# Patient Record
Sex: Female | Born: 1950 | Race: Black or African American | Hispanic: No | Marital: Single | Smoking: Former smoker
Health system: Southern US, Community
[De-identification: ages and names within clinical notes are randomized; demographics above are authoritative.]

## PROBLEM LIST (undated history)

## (undated) DIAGNOSIS — C801 Malignant (primary) neoplasm, unspecified: Secondary | ICD-10-CM

## (undated) DIAGNOSIS — I10 Essential (primary) hypertension: Secondary | ICD-10-CM

## (undated) HISTORY — PX: TUBAL LIGATION: SHX77

## (undated) HISTORY — PX: COLECTOMY: SHX59

---

## 1997-08-13 ENCOUNTER — Emergency Department (HOSPITAL_COMMUNITY): Admission: EM | Admit: 1997-08-13 | Discharge: 1997-08-13 | Payer: Self-pay | Admitting: Emergency Medicine

## 1997-09-26 ENCOUNTER — Encounter: Admission: RE | Admit: 1997-09-26 | Discharge: 1997-12-25 | Payer: Self-pay | Admitting: *Deleted

## 1998-05-19 ENCOUNTER — Other Ambulatory Visit: Admission: RE | Admit: 1998-05-19 | Discharge: 1998-05-19 | Payer: Self-pay

## 2000-04-30 ENCOUNTER — Emergency Department (HOSPITAL_COMMUNITY): Admission: EM | Admit: 2000-04-30 | Discharge: 2000-04-30 | Payer: Self-pay | Admitting: Emergency Medicine

## 2001-11-14 ENCOUNTER — Emergency Department (HOSPITAL_COMMUNITY): Admission: EM | Admit: 2001-11-14 | Discharge: 2001-11-15 | Payer: Self-pay | Admitting: *Deleted

## 2001-11-14 ENCOUNTER — Encounter: Payer: Self-pay | Admitting: *Deleted

## 2007-03-22 ENCOUNTER — Encounter: Admission: RE | Admit: 2007-03-22 | Discharge: 2007-03-22 | Payer: Self-pay | Admitting: Gastroenterology

## 2007-04-20 ENCOUNTER — Inpatient Hospital Stay (HOSPITAL_COMMUNITY): Admission: RE | Admit: 2007-04-20 | Discharge: 2007-04-24 | Payer: Self-pay | Admitting: General Surgery

## 2007-04-20 ENCOUNTER — Encounter (INDEPENDENT_AMBULATORY_CARE_PROVIDER_SITE_OTHER): Payer: Self-pay | Admitting: General Surgery

## 2007-04-28 ENCOUNTER — Ambulatory Visit: Payer: Self-pay | Admitting: Hematology and Oncology

## 2007-05-05 LAB — COMPREHENSIVE METABOLIC PANEL
AST: 18 U/L (ref 0–37)
Alkaline Phosphatase: 105 U/L (ref 39–117)
BUN: 18 mg/dL (ref 6–23)
Calcium: 9.1 mg/dL (ref 8.4–10.5)
Chloride: 100 mEq/L (ref 96–112)
Creatinine, Ser: 1.15 mg/dL (ref 0.40–1.20)

## 2007-05-05 LAB — IRON AND TIBC
TIBC: 348 ug/dL (ref 250–470)
UIBC: 314 ug/dL

## 2007-05-05 LAB — CBC WITH DIFFERENTIAL/PLATELET
Basophils Absolute: 0 10*3/uL (ref 0.0–0.1)
EOS%: 4.1 % (ref 0.0–7.0)
Eosinophils Absolute: 0.3 10*3/uL (ref 0.0–0.5)
LYMPH%: 23.9 % (ref 14.0–48.0)
MCH: 27.5 pg (ref 26.0–34.0)
MCV: 82.4 fL (ref 81.0–101.0)
MONO%: 7.4 % (ref 0.0–13.0)
NEUT#: 5.3 10*3/uL (ref 1.5–6.5)
Platelets: 577 10*3/uL — ABNORMAL HIGH (ref 145–400)
RBC: 3.52 10*6/uL — ABNORMAL LOW (ref 3.70–5.32)
RDW: 16 % — ABNORMAL HIGH (ref 11.3–14.5)

## 2007-05-10 ENCOUNTER — Ambulatory Visit (HOSPITAL_COMMUNITY): Admission: RE | Admit: 2007-05-10 | Discharge: 2007-05-10 | Payer: Self-pay | Admitting: Hematology and Oncology

## 2007-07-06 ENCOUNTER — Encounter: Admission: RE | Admit: 2007-07-06 | Discharge: 2007-07-06 | Payer: Self-pay | Admitting: General Surgery

## 2007-07-31 ENCOUNTER — Ambulatory Visit: Payer: Self-pay | Admitting: Hematology and Oncology

## 2007-08-21 LAB — CBC WITH DIFFERENTIAL/PLATELET
BASO%: 0.9 % (ref 0.0–2.0)
Basophils Absolute: 0.1 10*3/uL (ref 0.0–0.1)
EOS%: 4.7 % (ref 0.0–7.0)
HGB: 11.5 g/dL — ABNORMAL LOW (ref 11.6–15.9)
MCH: 29.2 pg (ref 26.0–34.0)
MCHC: 33.9 g/dL (ref 32.0–36.0)
MCV: 86.2 fL (ref 81.0–101.0)
MONO%: 5.9 % (ref 0.0–13.0)
RBC: 3.93 10*6/uL (ref 3.70–5.32)
RDW: 15.9 % — ABNORMAL HIGH (ref 11.3–14.5)
lymph#: 3 10*3/uL (ref 0.9–3.3)

## 2007-08-21 LAB — COMPREHENSIVE METABOLIC PANEL
ALT: 12 U/L (ref 0–35)
AST: 12 U/L (ref 0–37)
Albumin: 4 g/dL (ref 3.5–5.2)
Alkaline Phosphatase: 97 U/L (ref 39–117)
BUN: 14 mg/dL (ref 6–23)
Calcium: 8.8 mg/dL (ref 8.4–10.5)
Chloride: 104 mEq/L (ref 96–112)
Potassium: 3.8 mEq/L (ref 3.5–5.3)
Sodium: 140 mEq/L (ref 135–145)

## 2007-11-17 ENCOUNTER — Ambulatory Visit: Payer: Self-pay | Admitting: Hematology and Oncology

## 2007-11-21 ENCOUNTER — Ambulatory Visit (HOSPITAL_COMMUNITY): Admission: RE | Admit: 2007-11-21 | Discharge: 2007-11-21 | Payer: Self-pay | Admitting: Hematology and Oncology

## 2007-11-21 LAB — COMPREHENSIVE METABOLIC PANEL
ALT: 16 U/L (ref 0–35)
AST: 21 U/L (ref 0–37)
Albumin: 3.6 g/dL (ref 3.5–5.2)
CO2: 27 mEq/L (ref 19–32)
Calcium: 8.8 mg/dL (ref 8.4–10.5)
Chloride: 104 mEq/L (ref 96–112)
Creatinine, Ser: 1.03 mg/dL (ref 0.40–1.20)
Potassium: 3.8 mEq/L (ref 3.5–5.3)

## 2007-11-21 LAB — CBC WITH DIFFERENTIAL/PLATELET
EOS%: 2.4 % (ref 0.0–7.0)
Eosinophils Absolute: 0.2 10*3/uL (ref 0.0–0.5)
LYMPH%: 41.8 % (ref 14.0–48.0)
MCH: 29.9 pg (ref 26.0–34.0)
MCV: 89.6 fL (ref 81.0–101.0)
MONO%: 8 % (ref 0.0–13.0)
NEUT#: 3.7 10*3/uL (ref 1.5–6.5)
Platelets: 333 10*3/uL (ref 145–400)
RBC: 4.08 10*6/uL (ref 3.70–5.32)
RDW: 15.7 % — ABNORMAL HIGH (ref 11.3–14.5)

## 2007-11-21 LAB — CEA: CEA: 1.6 ng/mL (ref 0.0–5.0)

## 2008-05-17 ENCOUNTER — Ambulatory Visit: Payer: Self-pay | Admitting: Hematology and Oncology

## 2008-05-22 ENCOUNTER — Ambulatory Visit (HOSPITAL_COMMUNITY): Admission: RE | Admit: 2008-05-22 | Discharge: 2008-05-22 | Payer: Self-pay | Admitting: Hematology and Oncology

## 2008-05-22 LAB — CBC WITH DIFFERENTIAL/PLATELET
BASO%: 0.4 % (ref 0.0–2.0)
Basophils Absolute: 0 10*3/uL (ref 0.0–0.1)
EOS%: 2.7 % (ref 0.0–7.0)
HGB: 13.1 g/dL (ref 11.6–15.9)
MCH: 30.3 pg (ref 25.1–34.0)
MCV: 89.6 fL (ref 79.5–101.0)
MONO%: 7.7 % (ref 0.0–14.0)
RBC: 4.33 10*6/uL (ref 3.70–5.45)
RDW: 13.9 % (ref 11.2–14.5)
lymph#: 2.1 10*3/uL (ref 0.9–3.3)

## 2008-05-22 LAB — COMPREHENSIVE METABOLIC PANEL
ALT: 18 U/L (ref 0–35)
AST: 22 U/L (ref 0–37)
Albumin: 3.7 g/dL (ref 3.5–5.2)
Alkaline Phosphatase: 114 U/L (ref 39–117)
Calcium: 9 mg/dL (ref 8.4–10.5)
Chloride: 103 mEq/L (ref 96–112)
Potassium: 3.6 mEq/L (ref 3.5–5.3)
Sodium: 141 mEq/L (ref 135–145)
Total Protein: 7.8 g/dL (ref 6.0–8.3)

## 2009-01-09 ENCOUNTER — Ambulatory Visit: Payer: Self-pay | Admitting: Hematology and Oncology

## 2009-02-20 ENCOUNTER — Ambulatory Visit: Payer: Self-pay | Admitting: Hematology and Oncology

## 2009-02-25 LAB — CBC WITH DIFFERENTIAL/PLATELET
Basophils Absolute: 0.1 10*3/uL (ref 0.0–0.1)
Eosinophils Absolute: 0.3 10*3/uL (ref 0.0–0.5)
HCT: 37.7 % (ref 34.8–46.6)
HGB: 12.5 g/dL (ref 11.6–15.9)
LYMPH%: 35.4 % (ref 14.0–49.7)
MCHC: 33.3 g/dL (ref 31.5–36.0)
MONO#: 0.6 10*3/uL (ref 0.1–0.9)
NEUT%: 55.2 % (ref 38.4–76.8)
Platelets: 345 10*3/uL (ref 145–400)
WBC: 10.2 10*3/uL (ref 3.9–10.3)

## 2009-02-25 LAB — CEA: CEA: 1.7 ng/mL (ref 0.0–5.0)

## 2009-02-25 LAB — COMPREHENSIVE METABOLIC PANEL
BUN: 15 mg/dL (ref 6–23)
CO2: 22 mEq/L (ref 19–32)
Creatinine, Ser: 0.9 mg/dL (ref 0.40–1.20)
Glucose, Bld: 89 mg/dL (ref 70–99)
Total Bilirubin: 0.5 mg/dL (ref 0.3–1.2)

## 2009-07-19 IMAGING — CR DG CHEST 2V
2 series · 2 of 2 positions shown · non-contrast
Comparison: None.

CLINICAL DATA: Preop evaluation for right colon mass

[view not recorded (1 of 2)]
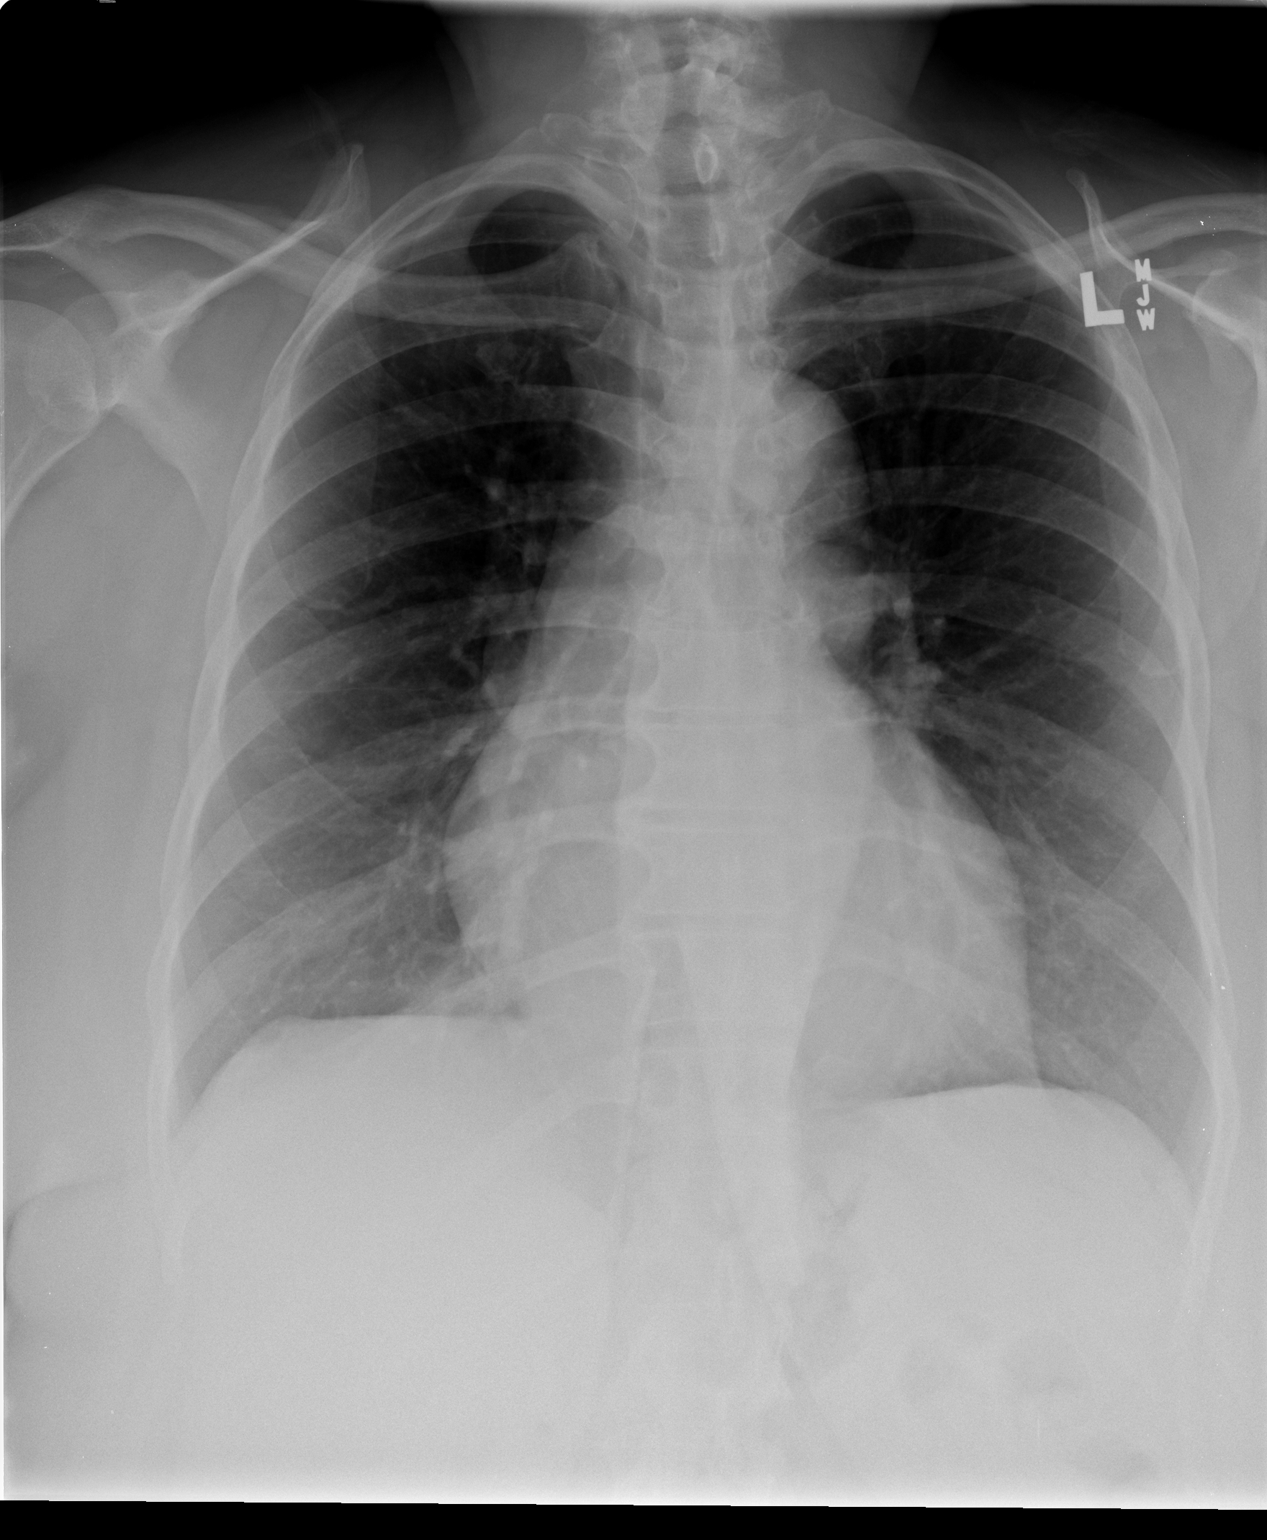

[view not recorded (2 of 2)]
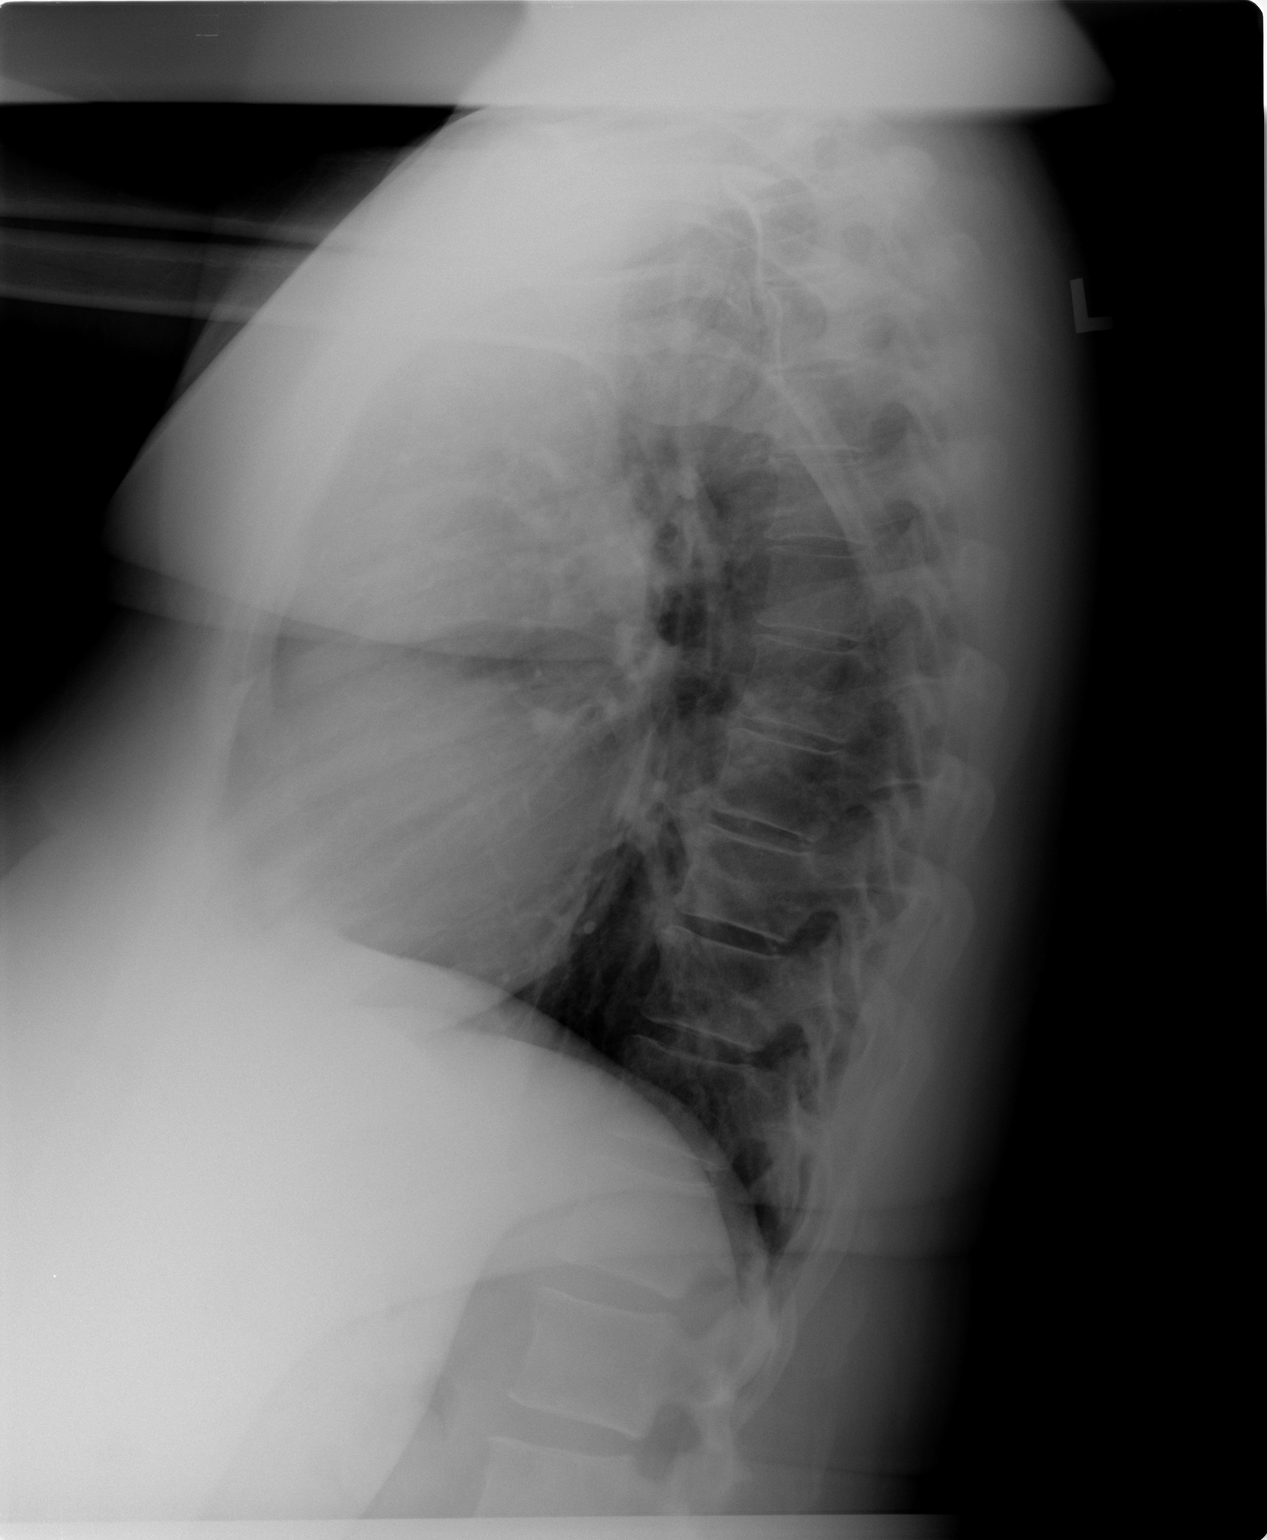

[2 of 2 positions shown; findings below may reference images not displayed]

CHEST - 2 VIEW:

Lungs are clear. Cardiopericardial silhouette is at upper limits of normal to
borderline enlarged for size. No parenchymal nodule or mass is evident. Imaged
bony structures of the thorax are intact.
IMPRESSION: No acute cardiopulmonary process

## 2009-11-21 ENCOUNTER — Ambulatory Visit: Payer: Self-pay | Admitting: Hematology and Oncology

## 2010-03-14 ENCOUNTER — Encounter: Payer: Self-pay | Admitting: Hematology and Oncology

## 2010-07-07 NOTE — Discharge Summary (Signed)
NAMEBARBRA, MINER            ACCOUNT NO.:  [id number]   MEDICAL RECORD NO.:  [id number]          PATIENT TYPE:  INP   LOCATION:  5151                         FACILITY:  [facility]   PHYSICIAN:  Cherylynn Ridges, M.D.    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   PRINCIPAL PROCEDURE:  Right colectomy by Dr. Lindie Spruce, done on day of  admission, April 20, 2007.   She is being discharged home in care of a friend and family.   COMPLICATIONS:  None.   CONDITION:  Good.   DISCHARGE MEDICATIONS:  Vicodin 1-2 tablets every 4 hours as needed for  pain.   FOLLOWUP:  She is to follow up to see me in approximately 1 week.   WOUND CARE:  Shower and pat her wound dry and cover it to keep it from  clothes.  She is to return to see me again in 1 week.   HOSPITAL COURSE:  The patient is a 60 year old female who is found to  have a large, dysplastic polyp of the right colon.  Biopsies  demonstrated to be tubular villous adenoma with dysplasia.  She came in  for a right colectomy.  This was performed with good margins grossly.  However, pathology is still pending.   Postoperatively with the assistance of Inter EDbeing give orally, the  patient went on to start having bowel activity on postop day #3, was  advanced to a soft diet and had several bowel movements prior to  discharge.  She was discharged on postop day #4.  Her wound looked clean  and dry with no evidence of infection.  No drainage.  She had been  afebrile for 24 hours prior to discharge.  Her blood pressure and pulse  were normal.  She is to follow up see me in approximately 1 week.      Cherylynn Ridges, M.D.  Electronically Signed     JOW/MEDQ  D:  04/24/2007  T:   04/24/2007  Job:  [id number]   cc:   Cherylynn Ridges, M.D.

## 2010-07-07 NOTE — Op Note (Signed)
[name], [name]            ACCOUNT NO.:  [id number]   MEDICAL RECORD NO.:  [id number]          PATIENT TYPE:  INP   LOCATION:  2899                         FACILITY:  [facility]   PHYSICIAN:  Cherylynn Ridges, M.D.    DATE OF BIRTH:  [birth year]   DATE OF PROCEDURE:  04/20/2007  DATE OF DISCHARGE:                               OPERATIVE REPORT   PREOPERATIVE DIAGNOSIS:  Dysplastic right colon polyp.   POSTOPERATIVE DIAGNOSIS:  Dysplastic right colon polyp.   PROCEDURE:  Partial right colectomy.   SURGEON:  Cherylynn Ridges, M.D.   ASSISTANT:  Leonie Man, M.D.   ANESTHESIA:  General endotracheal.   ESTIMATED BLOOD LOSS:  Less than 100 mL.   COMPLICATIONS:  None.   CONDITION:  Stable.   FINDINGS:  A sessile polyp about 3-4 cm in size of the mid right  descending colon.  Other findings of normal liver, spleen, stomach and  duodenum, no evidence of metastatic disease.   INDICATIONS FOR OPERATION:  The patient is a 60 year old female, on a  routine colonoscopy was found to have a polyp of the right colon that  was too broad-based and large for removal colonoscopically, who now  comes in for colectomy.   OPERATION:  The patient was taken to the operating room and placed on  the table in a supine position.  After an adequate general endotracheal  anesthetic was administered, she was prepped and draped in the usual  sterile manner exposing the midline.   A midline incision was made, which ended up being approximately 15 cm  long, above and to the left of the umbilicus down to below the  umbilicus.  This was taken down to and through the midline fascia.  The  patient had at least 2-1/2 to 3 inches of pannus.   Once we got down to the midline fascia, we entered the peritoneal cavity  using electrocautery, taking care not to injure the underlying bowel.  There were omental adhesions to the anterior abdominal wall in the  umbilical area, which were taken down using  electrocautery with a 2-0  silk tie being placed on the remaining omentum.   We placed the patient in Trendelenburg and the left side was tilted  down, then the surgeon went to the patient's left.  With retractors in  place we dissected out the right colon at the line of Toldt up to and  including the proximal portion of the right transverse colon and the  hepatic flexure.  The polyp that had been noted colonoscopically was  palpated transmurally in the operating room.  We mobilized the entire  right colon including down beyond the terminal ileum including the  appendix, which was removed with the specimen.  I GIA-75 was brought  across the distal ascending colon and the terminal ileum.  The  subsequent ends of the terminal ileum and the proximal transverse colon  were brought together using a GIA-75 stapler with the resulting  enterotomy being closed with a TX-60 blue 3.5-mm closure stapler.  The  intervening mesentery was closed using interrupted figure-of-eight  stitches of 2-0 silk.  We inspected the right colon and paracolic area  for bleeding.  There was minimal bleeding and it was irrigated with  saline.  Prior to closure and doing the anasetomosis, the specimen was  taken off the field and opened showing the sessile polyp.   Once this was completed, the anastomosis was completed and the mesentery  was closed and irrigation was done, adequate hemostasis was obtained.  We closed the midline fascia using a running looped #1 PDS suture.  We  irrigated the subcu with saline and then closed the skin using stainless  steel staples.  All needle counts, sponge counts and instrument counts  were correct.      Cherylynn Ridges, M.D.  Electronically Signed     JOW/MEDQ  D:  04/20/2007  T:  04/21/2007  Job:  [id number]   cc:   Merlene Laughter. Renae Gloss, M.D.  Anselmo Rod, M.D.

## 2010-07-30 ENCOUNTER — Other Ambulatory Visit: Payer: Self-pay | Admitting: Hematology and Oncology

## 2010-07-30 DIAGNOSIS — C189 Malignant neoplasm of colon, unspecified: Secondary | ICD-10-CM

## 2010-08-03 ENCOUNTER — Encounter (HOSPITAL_COMMUNITY)
Admission: RE | Admit: 2010-08-03 | Discharge: 2010-08-03 | Disposition: A | Discharge status: Home / Self Care | Payer: BC Managed Care – PPO | Source: Ambulatory Visit | Attending: Hematology and Oncology | Admitting: Hematology and Oncology

## 2010-08-03 ENCOUNTER — Encounter (HOSPITAL_BASED_OUTPATIENT_CLINIC_OR_DEPARTMENT_OTHER): Payer: BC Managed Care – PPO | Admitting: Hematology and Oncology

## 2010-08-03 ENCOUNTER — Other Ambulatory Visit: Payer: Self-pay | Admitting: Hematology and Oncology

## 2010-08-03 ENCOUNTER — Encounter (HOSPITAL_COMMUNITY): Payer: Self-pay

## 2010-08-03 DIAGNOSIS — C189 Malignant neoplasm of colon, unspecified: Secondary | ICD-10-CM

## 2010-08-03 DIAGNOSIS — C182 Malignant neoplasm of ascending colon: Secondary | ICD-10-CM

## 2010-08-03 DIAGNOSIS — N859 Noninflammatory disorder of uterus, unspecified: Secondary | ICD-10-CM | POA: Insufficient documentation

## 2010-08-03 DIAGNOSIS — K7689 Other specified diseases of liver: Secondary | ICD-10-CM | POA: Insufficient documentation

## 2010-08-03 HISTORY — DX: Malignant (primary) neoplasm, unspecified: C80.1

## 2010-08-03 HISTORY — DX: Essential (primary) hypertension: I10

## 2010-08-03 LAB — CBC WITH DIFFERENTIAL/PLATELET
BASO%: 0.3 % (ref 0.0–2.0)
Basophils Absolute: 0 10*3/uL (ref 0.0–0.1)
HCT: 38.2 % (ref 34.8–46.6)
HGB: 12.8 g/dL (ref 11.6–15.9)
MONO#: 0.5 10*3/uL (ref 0.1–0.9)
NEUT%: 47.7 % (ref 38.4–76.8)
RDW: 14.2 % (ref 11.2–14.5)
WBC: 8.1 10*3/uL (ref 3.9–10.3)
lymph#: 3.5 10*3/uL — ABNORMAL HIGH (ref 0.9–3.3)

## 2010-08-03 LAB — CMP (CANCER CENTER ONLY)
ALT(SGPT): 17 U/L (ref 10–47)
Albumin: 3.5 g/dL (ref 3.3–5.5)
BUN, Bld: 10 mg/dL (ref 7–22)
CO2: 29 mEq/L (ref 18–33)
Calcium: 9.2 mg/dL (ref 8.0–10.3)
Chloride: 96 mEq/L — ABNORMAL LOW (ref 98–108)
Creat: 0.9 mg/dl (ref 0.6–1.2)
Potassium: 4.1 mEq/L (ref 3.3–4.7)

## 2010-08-03 LAB — CEA: CEA: 1.2 ng/mL (ref 0.0–5.0)

## 2010-08-03 MED ORDER — IOHEXOL 300 MG/ML  SOLN
125.0000 mL | Freq: Once | INTRAMUSCULAR | Status: AC | PRN
  Administered 2010-08-03: 125 mL via INTRAVENOUS

## 2010-08-07 ENCOUNTER — Encounter (HOSPITAL_BASED_OUTPATIENT_CLINIC_OR_DEPARTMENT_OTHER): Payer: BC Managed Care – PPO | Admitting: Hematology and Oncology

## 2010-08-07 DIAGNOSIS — C182 Malignant neoplasm of ascending colon: Secondary | ICD-10-CM

## 2010-08-27 ENCOUNTER — Other Ambulatory Visit: Payer: Self-pay | Admitting: Internal Medicine

## 2010-08-27 DIAGNOSIS — R1900 Intra-abdominal and pelvic swelling, mass and lump, unspecified site: Secondary | ICD-10-CM

## 2010-08-28 ENCOUNTER — Other Ambulatory Visit: Payer: Self-pay | Admitting: Internal Medicine

## 2010-08-28 DIAGNOSIS — R1900 Intra-abdominal and pelvic swelling, mass and lump, unspecified site: Secondary | ICD-10-CM

## 2010-09-02 ENCOUNTER — Ambulatory Visit
Admission: RE | Admit: 2010-09-02 | Discharge: 2010-09-02 | Disposition: A | Discharge status: Home / Self Care | Payer: BC Managed Care – PPO | Source: Ambulatory Visit | Attending: Internal Medicine | Admitting: Internal Medicine

## 2010-09-02 DIAGNOSIS — R1900 Intra-abdominal and pelvic swelling, mass and lump, unspecified site: Secondary | ICD-10-CM

## 2010-11-13 LAB — COMPREHENSIVE METABOLIC PANEL
AST: 19
Albumin: 3.5
Chloride: 104
Creatinine, Ser: 0.89
GFR calc Af Amer: >60
Potassium: 3.3 — ABNORMAL LOW
Total Bilirubin: 0.4

## 2010-11-13 LAB — CBC
MCV: 83.3
Platelets: 386
WBC: 8.4

## 2010-11-13 LAB — DIFFERENTIAL
Basophils Absolute: 0.1
Eosinophils Relative: 2
Lymphocytes Relative: 26
Monocytes Absolute: 0.4

## 2010-11-13 LAB — ABO/RH: ABO/RH(D): B POS

## 2010-11-13 LAB — TYPE AND SCREEN: ABO/RH(D): B POS

## 2011-07-14 ENCOUNTER — Other Ambulatory Visit: Payer: Self-pay | Admitting: Hematology and Oncology

## 2011-07-14 ENCOUNTER — Other Ambulatory Visit (HOSPITAL_COMMUNITY): Payer: BC Managed Care – PPO

## 2011-07-14 ENCOUNTER — Telehealth: Payer: Self-pay | Admitting: Hematology and Oncology

## 2011-07-14 DIAGNOSIS — C189 Malignant neoplasm of colon, unspecified: Secondary | ICD-10-CM

## 2011-07-14 NOTE — Telephone Encounter (Signed)
lmonvm for pt re appt for 6/19 lb/ct and 6/26 LO. Schedule referral mailed today.

## 2011-07-28 ENCOUNTER — Other Ambulatory Visit: Payer: Self-pay | Admitting: *Deleted

## 2011-07-28 ENCOUNTER — Telehealth: Payer: Self-pay | Admitting: *Deleted

## 2011-07-28 NOTE — Telephone Encounter (Signed)
per orders from 07-28-2011 moved patient to NP schedule same time at 2:30pm

## 2011-08-11 ENCOUNTER — Other Ambulatory Visit: Payer: BC Managed Care – PPO | Admitting: Lab

## 2011-08-11 ENCOUNTER — Ambulatory Visit (HOSPITAL_COMMUNITY): Payer: BC Managed Care – PPO

## 2011-08-18 ENCOUNTER — Encounter: Payer: BC Managed Care – PPO | Admitting: Nurse Practitioner

## 2011-08-23 ENCOUNTER — Other Ambulatory Visit: Payer: Self-pay | Admitting: *Deleted

## 2011-08-24 ENCOUNTER — Telehealth: Payer: Self-pay | Admitting: *Deleted

## 2011-08-24 NOTE — Telephone Encounter (Signed)
Pt FTKA for f/u with Ramond Craver, NP on 08/18/11.    Spoke with pt today and was informed that pt had called office to cancel appt on 08/18/11.   Pt stated she wished to reschedule appt in August.   Pt stated she was doing fine , no problems.   Informed pt that a scheduler will contact pt with appt schedule as instructed by md.   Pt voiced understanding.

## 2011-09-17 ENCOUNTER — Other Ambulatory Visit: Payer: Self-pay | Admitting: *Deleted

## 2011-09-17 DIAGNOSIS — C189 Malignant neoplasm of colon, unspecified: Secondary | ICD-10-CM

## 2011-09-22 ENCOUNTER — Telehealth: Payer: Self-pay | Admitting: Hematology and Oncology

## 2011-09-22 NOTE — Telephone Encounter (Signed)
l/m with appt info and to p./u contrast    aom

## 2011-10-04 ENCOUNTER — Telehealth: Payer: Self-pay | Admitting: Hematology and Oncology

## 2011-10-04 ENCOUNTER — Other Ambulatory Visit: Payer: BC Managed Care – PPO | Admitting: Lab

## 2011-10-04 ENCOUNTER — Other Ambulatory Visit: Payer: Self-pay | Admitting: *Deleted

## 2011-10-04 ENCOUNTER — Telehealth: Payer: Self-pay | Admitting: *Deleted

## 2011-10-04 ENCOUNTER — Other Ambulatory Visit (HOSPITAL_COMMUNITY): Payer: BC Managed Care – PPO

## 2011-10-04 DIAGNOSIS — C189 Malignant neoplasm of colon, unspecified: Secondary | ICD-10-CM

## 2011-10-04 NOTE — Telephone Encounter (Signed)
Pt called to inform nurse re:  Pt was not aware of appts for August.  Pt stated August is not good for pt to have any appts.   Pt had rescheduled CT scans for  11/02/11.    Informed pt that a scheduler will contact pt for labs prior to CT scans, and a f/u appt with NP will be rescheduled also.   Pt voiced understanding. Pt's  Phone    [phone].

## 2011-10-04 NOTE — Telephone Encounter (Signed)
Pt called today to r/s appts for lb/ct/fu. Pt given number to r/s ct and will call back to r/s lb/fu.

## 2011-10-04 NOTE — Telephone Encounter (Signed)
S/w pt re d/t's for lb 9/10 and f/u 9/13. Pt has already changed scan to 9/10. Pt will get prep prior to 9/10 ct appt.

## 2011-10-07 ENCOUNTER — Ambulatory Visit: Payer: BC Managed Care – PPO | Admitting: Family

## 2011-11-02 ENCOUNTER — Other Ambulatory Visit (HOSPITAL_COMMUNITY): Payer: BC Managed Care – PPO

## 2011-11-02 ENCOUNTER — Other Ambulatory Visit: Payer: BC Managed Care – PPO | Admitting: Lab

## 2011-11-03 ENCOUNTER — Telehealth: Payer: Self-pay | Admitting: Nurse Practitioner

## 2011-11-03 NOTE — Telephone Encounter (Signed)
Pt FTKA lab and CT scan appt.   Called pt to inquire if/when she was rescheduling CT/ lab.  Need to inform her that MD appt will need to be moved to after CT scan.  MD visit currently scheduled for 9/13.    This is patient's fourth no show to CT scan appointment.

## 2011-11-05 ENCOUNTER — Telehealth: Payer: Self-pay | Admitting: Nurse Practitioner

## 2011-11-05 ENCOUNTER — Ambulatory Visit: Payer: BC Managed Care – PPO | Admitting: Family

## 2011-11-05 NOTE — Telephone Encounter (Signed)
Left second message for patient re: missed appointments.  Req return call to this office re: would she like to be rescheduled or is her preference to discontinue visits here?

## 2012-05-02 NOTE — Progress Notes (Signed)
Patient was no-show, encounter closed

## 2019-04-28 ENCOUNTER — Ambulatory Visit: Payer: BC Managed Care – PPO

## 2019-05-19 ENCOUNTER — Ambulatory Visit: Payer: BC Managed Care – PPO

## 2019-06-02 ENCOUNTER — Ambulatory Visit: Payer: BC Managed Care – PPO | Attending: Internal Medicine

## 2019-06-02 DIAGNOSIS — Z23 Encounter for immunization: Secondary | ICD-10-CM

## 2019-06-02 NOTE — Progress Notes (Signed)
   Covid-19 Vaccination Clinic  Name:  Lejla Taflinger    MRN: [id number] DOB: 1950-08-10  06/02/2019  Ms. Vittone was observed post Covid-19 immunization for 15 minutes without incident. She was provided with Vaccine Information Sheet and instruction to access the V-Safe system.   Ms. Piotrowicz was instructed to call 911 with any severe reactions post vaccine: Marland Kitchen Difficulty breathing  . Swelling of face and throat  . A fast heartbeat  . A bad rash all over body  . Dizziness and weakness   Immunizations Administered    Name Date Dose VIS Date Route   Moderna COVID-19 Vaccine 06/02/2019 10:19 AM 0.5 mL 01/23/2019 Intramuscular   Manufacturer: Levan Hurst   Lot: [id number]   [id number]

## 2019-06-30 ENCOUNTER — Ambulatory Visit: Payer: BC Managed Care – PPO | Attending: Internal Medicine

## 2019-06-30 DIAGNOSIS — Z23 Encounter for immunization: Secondary | ICD-10-CM

## 2019-06-30 NOTE — Progress Notes (Signed)
   Covid-19 Vaccination Clinic  Name:  [name]    MRN: [id number] DOB: 1950/12/26  06/30/2019  [name] was observed post Covid-19 immunization for 15 minutes without incident. She was provided with Vaccine Information Sheet and instruction to access the V-Safe system.   [name] was instructed to call 911 with any severe reactions post vaccine: Marland Kitchen Difficulty breathing  . Swelling of face and throat  . A fast heartbeat  . A bad rash all over body  . Dizziness and weakness   Immunizations Administered    Name Date Dose VIS Date Route   Moderna COVID-19 Vaccine 06/30/2019  9:55 AM 0.5 mL 01/2019 Intramuscular   Manufacturer: Moderna   Lot: [id number]   [id number]

## 2021-04-23 ENCOUNTER — Ambulatory Visit
Admission: RE | Admit: 2021-04-23 | Discharge: 2021-04-23 | Disposition: A | Discharge status: Home / Self Care | Payer: Medicare PPO | Source: Ambulatory Visit | Attending: Family Medicine | Admitting: Family Medicine

## 2021-04-23 ENCOUNTER — Other Ambulatory Visit: Payer: Self-pay | Admitting: Family Medicine

## 2021-04-23 DIAGNOSIS — M549 Dorsalgia, unspecified: Secondary | ICD-10-CM

## 2021-04-23 DIAGNOSIS — M25559 Pain in unspecified hip: Secondary | ICD-10-CM

## 2021-04-24 ENCOUNTER — Other Ambulatory Visit: Payer: Self-pay | Admitting: Family Medicine

## 2021-04-24 DIAGNOSIS — R29898 Other symptoms and signs involving the musculoskeletal system: Secondary | ICD-10-CM

## 2021-05-10 ENCOUNTER — Ambulatory Visit
Admission: RE | Admit: 2021-05-10 | Discharge: 2021-05-10 | Disposition: A | Discharge status: Home / Self Care | Payer: Medicare PPO | Source: Ambulatory Visit | Attending: Family Medicine | Admitting: Family Medicine

## 2021-05-10 ENCOUNTER — Other Ambulatory Visit: Payer: Self-pay

## 2021-05-10 DIAGNOSIS — R29898 Other symptoms and signs involving the musculoskeletal system: Secondary | ICD-10-CM

## 2022-12-28 DIAGNOSIS — R011 Cardiac murmur, unspecified: Secondary | ICD-10-CM | POA: Diagnosis not present

## 2022-12-28 DIAGNOSIS — Z87891 Personal history of nicotine dependence: Secondary | ICD-10-CM | POA: Diagnosis not present

## 2022-12-28 DIAGNOSIS — R269 Unspecified abnormalities of gait and mobility: Secondary | ICD-10-CM | POA: Diagnosis not present

## 2022-12-28 DIAGNOSIS — R32 Unspecified urinary incontinence: Secondary | ICD-10-CM | POA: Diagnosis not present

## 2022-12-28 DIAGNOSIS — R7303 Prediabetes: Secondary | ICD-10-CM | POA: Diagnosis not present

## 2022-12-28 DIAGNOSIS — R030 Elevated blood-pressure reading, without diagnosis of hypertension: Secondary | ICD-10-CM | POA: Diagnosis not present

## 2022-12-28 DIAGNOSIS — M170 Bilateral primary osteoarthritis of knee: Secondary | ICD-10-CM | POA: Diagnosis not present

## 2022-12-28 DIAGNOSIS — G35 Multiple sclerosis: Secondary | ICD-10-CM | POA: Diagnosis not present

## 2023-12-29 ENCOUNTER — Encounter (HOSPITAL_COMMUNITY): Payer: Self-pay

## 2023-12-29 ENCOUNTER — Emergency Department (HOSPITAL_COMMUNITY)

## 2023-12-29 ENCOUNTER — Inpatient Hospital Stay (HOSPITAL_COMMUNITY): Admission: EM | Admit: 2023-12-29 | Discharge: 2024-01-23 | DRG: 682 | Disposition: E

## 2023-12-29 ENCOUNTER — Other Ambulatory Visit: Payer: Self-pay

## 2023-12-29 DIAGNOSIS — M4313 Spondylolisthesis, cervicothoracic region: Secondary | ICD-10-CM | POA: Diagnosis not present

## 2023-12-29 DIAGNOSIS — Y92013 Bedroom of single-family (private) house as the place of occurrence of the external cause: Secondary | ICD-10-CM

## 2023-12-29 DIAGNOSIS — R4182 Altered mental status, unspecified: Secondary | ICD-10-CM | POA: Diagnosis not present

## 2023-12-29 DIAGNOSIS — E860 Dehydration: Secondary | ICD-10-CM

## 2023-12-29 DIAGNOSIS — A419 Sepsis, unspecified organism: Secondary | ICD-10-CM | POA: Diagnosis not present

## 2023-12-29 DIAGNOSIS — I21A1 Myocardial infarction type 2: Secondary | ICD-10-CM | POA: Diagnosis not present

## 2023-12-29 DIAGNOSIS — R410 Disorientation, unspecified: Secondary | ICD-10-CM | POA: Diagnosis not present

## 2023-12-29 DIAGNOSIS — N281 Cyst of kidney, acquired: Secondary | ICD-10-CM | POA: Diagnosis not present

## 2023-12-29 DIAGNOSIS — Z6841 Body Mass Index (BMI) 40.0 and over, adult: Secondary | ICD-10-CM | POA: Diagnosis not present

## 2023-12-29 DIAGNOSIS — W06XXXA Fall from bed, initial encounter: Secondary | ICD-10-CM | POA: Diagnosis present

## 2023-12-29 DIAGNOSIS — R404 Transient alteration of awareness: Secondary | ICD-10-CM | POA: Diagnosis not present

## 2023-12-29 DIAGNOSIS — D751 Secondary polycythemia: Secondary | ICD-10-CM | POA: Diagnosis present

## 2023-12-29 DIAGNOSIS — M7989 Other specified soft tissue disorders: Secondary | ICD-10-CM | POA: Diagnosis not present

## 2023-12-29 DIAGNOSIS — I5031 Acute diastolic (congestive) heart failure: Secondary | ICD-10-CM | POA: Diagnosis not present

## 2023-12-29 DIAGNOSIS — I110 Hypertensive heart disease with heart failure: Secondary | ICD-10-CM | POA: Diagnosis present

## 2023-12-29 DIAGNOSIS — R7989 Other specified abnormal findings of blood chemistry: Secondary | ICD-10-CM | POA: Diagnosis not present

## 2023-12-29 DIAGNOSIS — Z8673 Personal history of transient ischemic attack (TIA), and cerebral infarction without residual deficits: Secondary | ICD-10-CM | POA: Diagnosis not present

## 2023-12-29 DIAGNOSIS — M1990 Unspecified osteoarthritis, unspecified site: Secondary | ICD-10-CM | POA: Diagnosis present

## 2023-12-29 DIAGNOSIS — I951 Orthostatic hypotension: Secondary | ICD-10-CM | POA: Diagnosis present

## 2023-12-29 DIAGNOSIS — Z515 Encounter for palliative care: Secondary | ICD-10-CM

## 2023-12-29 DIAGNOSIS — I444 Left anterior fascicular block: Secondary | ICD-10-CM | POA: Diagnosis present

## 2023-12-29 DIAGNOSIS — I6782 Cerebral ischemia: Secondary | ICD-10-CM | POA: Diagnosis not present

## 2023-12-29 DIAGNOSIS — R531 Weakness: Secondary | ICD-10-CM | POA: Diagnosis not present

## 2023-12-29 DIAGNOSIS — W19XXXA Unspecified fall, initial encounter: Secondary | ICD-10-CM | POA: Diagnosis not present

## 2023-12-29 DIAGNOSIS — Z87891 Personal history of nicotine dependence: Secondary | ICD-10-CM

## 2023-12-29 DIAGNOSIS — R918 Other nonspecific abnormal finding of lung field: Secondary | ICD-10-CM | POA: Diagnosis not present

## 2023-12-29 DIAGNOSIS — E872 Acidosis, unspecified: Secondary | ICD-10-CM | POA: Diagnosis present

## 2023-12-29 DIAGNOSIS — R34 Anuria and oliguria: Secondary | ICD-10-CM | POA: Diagnosis not present

## 2023-12-29 DIAGNOSIS — R9431 Abnormal electrocardiogram [ECG] [EKG]: Secondary | ICD-10-CM | POA: Diagnosis not present

## 2023-12-29 DIAGNOSIS — I517 Cardiomegaly: Secondary | ICD-10-CM | POA: Diagnosis not present

## 2023-12-29 DIAGNOSIS — Z66 Do not resuscitate: Secondary | ICD-10-CM | POA: Diagnosis not present

## 2023-12-29 DIAGNOSIS — R5381 Other malaise: Secondary | ICD-10-CM | POA: Diagnosis not present

## 2023-12-29 DIAGNOSIS — E66813 Obesity, class 3: Secondary | ICD-10-CM | POA: Diagnosis present

## 2023-12-29 DIAGNOSIS — N179 Acute kidney failure, unspecified: Secondary | ICD-10-CM | POA: Diagnosis not present

## 2023-12-29 DIAGNOSIS — G9341 Metabolic encephalopathy: Secondary | ICD-10-CM | POA: Diagnosis not present

## 2023-12-29 DIAGNOSIS — R571 Hypovolemic shock: Secondary | ICD-10-CM | POA: Diagnosis not present

## 2023-12-29 DIAGNOSIS — Z85038 Personal history of other malignant neoplasm of large intestine: Secondary | ICD-10-CM | POA: Diagnosis not present

## 2023-12-29 DIAGNOSIS — G9389 Other specified disorders of brain: Secondary | ICD-10-CM | POA: Diagnosis not present

## 2023-12-29 DIAGNOSIS — J9811 Atelectasis: Secondary | ICD-10-CM | POA: Diagnosis not present

## 2023-12-29 DIAGNOSIS — R5383 Other fatigue: Secondary | ICD-10-CM | POA: Diagnosis not present

## 2023-12-29 DIAGNOSIS — Z602 Problems related to living alone: Secondary | ICD-10-CM | POA: Diagnosis present

## 2023-12-29 DIAGNOSIS — U071 COVID-19: Secondary | ICD-10-CM | POA: Diagnosis present

## 2023-12-29 DIAGNOSIS — R42 Dizziness and giddiness: Secondary | ICD-10-CM | POA: Diagnosis not present

## 2023-12-29 DIAGNOSIS — J189 Pneumonia, unspecified organism: Secondary | ICD-10-CM | POA: Diagnosis not present

## 2023-12-29 DIAGNOSIS — Z043 Encounter for examination and observation following other accident: Secondary | ICD-10-CM | POA: Diagnosis not present

## 2023-12-29 DIAGNOSIS — I959 Hypotension, unspecified: Secondary | ICD-10-CM | POA: Diagnosis not present

## 2023-12-29 DIAGNOSIS — Z9049 Acquired absence of other specified parts of digestive tract: Secondary | ICD-10-CM

## 2023-12-29 DIAGNOSIS — M6282 Rhabdomyolysis: Secondary | ICD-10-CM | POA: Diagnosis present

## 2023-12-29 DIAGNOSIS — R579 Shock, unspecified: Secondary | ICD-10-CM | POA: Diagnosis not present

## 2023-12-29 DIAGNOSIS — R0602 Shortness of breath: Secondary | ICD-10-CM | POA: Diagnosis not present

## 2023-12-29 DIAGNOSIS — M47812 Spondylosis without myelopathy or radiculopathy, cervical region: Secondary | ICD-10-CM | POA: Diagnosis not present

## 2023-12-29 LAB — I-STAT CHEM 8, ED
BUN: 108 mg/dL — ABNORMAL HIGH (ref 8–23)
Calcium, Ion: 0.87 mmol/L — CL (ref 1.15–1.40)
Chloride: 104 mmol/L (ref 98–111)
Creatinine, Ser: 4.5 mg/dL — ABNORMAL HIGH (ref 0.44–1.00)
Glucose, Bld: 124 mg/dL — ABNORMAL HIGH (ref 70–99)
HCT: 60 % — ABNORMAL HIGH (ref 36.0–46.0)
Hemoglobin: 20.4 g/dL — ABNORMAL HIGH (ref 12.0–15.0)
Potassium: 5 mmol/L (ref 3.5–5.1)
Sodium: 134 mmol/L — ABNORMAL LOW (ref 135–145)
TCO2: 27 mmol/L (ref 22–32)

## 2023-12-29 LAB — URINALYSIS, W/ REFLEX TO CULTURE (INFECTION SUSPECTED)
Glucose, UA: 50 mg/dL — AB
Ketones, ur: NEGATIVE mg/dL
Leukocytes,Ua: NEGATIVE
Nitrite: NEGATIVE
Protein, ur: >=300 mg/dL — AB
Specific Gravity, Urine: 1.029 (ref 1.005–1.030)
pH: 5 (ref 5.0–8.0)

## 2023-12-29 LAB — CBC WITH DIFFERENTIAL/PLATELET
Abs Immature Granulocytes: 0.07 K/uL (ref 0.00–0.07)
Abs Immature Granulocytes: 0.05 K/uL (ref 0.00–0.07)
Basophils Absolute: 0 K/uL (ref 0.0–0.1)
Basophils Absolute: 0 K/uL (ref 0.0–0.1)
Basophils Relative: 0 %
Basophils Relative: 0 %
Eosinophils Absolute: 0 K/uL (ref 0.0–0.5)
Eosinophils Absolute: 0 K/uL (ref 0.0–0.5)
Eosinophils Relative: 0 %
Eosinophils Relative: 0 %
HCT: 57.2 % — ABNORMAL HIGH (ref 36.0–46.0)
HCT: 51.4 % — ABNORMAL HIGH (ref 36.0–46.0)
Hemoglobin: 20.2 g/dL — ABNORMAL HIGH (ref 12.0–15.0)
Hemoglobin: 17.9 g/dL — ABNORMAL HIGH (ref 12.0–15.0)
Immature Granulocytes: 1 %
Immature Granulocytes: 0 %
Lymphocytes Relative: 15 %
Lymphocytes Relative: 21 %
Lymphs Abs: 1.6 K/uL (ref 0.7–4.0)
Lymphs Abs: 2.5 K/uL (ref 0.7–4.0)
MCH: 30.7 pg (ref 26.0–34.0)
MCH: 30.8 pg (ref 26.0–34.0)
MCHC: 35.3 g/dL (ref 30.0–36.0)
MCHC: 34.8 g/dL (ref 30.0–36.0)
MCV: 86.8 fL (ref 80.0–100.0)
MCV: 88.3 fL (ref 80.0–100.0)
Monocytes Absolute: 0.4 K/uL (ref 0.1–1.0)
Monocytes Absolute: 0.6 K/uL (ref 0.1–1.0)
Monocytes Relative: 4 %
Monocytes Relative: 5 %
Neutro Abs: 8.4 K/uL — ABNORMAL HIGH (ref 1.7–7.7)
Neutro Abs: 8.6 K/uL — ABNORMAL HIGH (ref 1.7–7.7)
Neutrophils Relative %: 80 %
Neutrophils Relative %: 74 %
Platelets: 469 K/uL — ABNORMAL HIGH (ref 150–400)
Platelets: 373 K/uL (ref 150–400)
RBC: 6.59 MIL/uL — ABNORMAL HIGH (ref 3.87–5.11)
RBC: 5.82 MIL/uL — ABNORMAL HIGH (ref 3.87–5.11)
RDW: 19.4 % — ABNORMAL HIGH (ref 11.5–15.5)
RDW: 19.5 % — ABNORMAL HIGH (ref 11.5–15.5)
WBC: 10.5 K/uL (ref 4.0–10.5)
WBC: 11.8 K/uL — ABNORMAL HIGH (ref 4.0–10.5)
nRBC: 0 % (ref 0.0–0.2)
nRBC: 0 % (ref 0.0–0.2)

## 2023-12-29 LAB — RESP PANEL BY RT-PCR (RSV, FLU A&B, COVID)  RVPGX2
Influenza A by PCR: NEGATIVE
Influenza B by PCR: NEGATIVE
Resp Syncytial Virus by PCR: NEGATIVE
SARS Coronavirus 2 by RT PCR: POSITIVE — AB

## 2023-12-29 LAB — COMPREHENSIVE METABOLIC PANEL WITH GFR
ALT: 54 U/L — ABNORMAL HIGH (ref 0–44)
AST: 123 U/L — ABNORMAL HIGH (ref 15–41)
Albumin: <1.5 g/dL — ABNORMAL LOW (ref 3.5–5.0)
Alkaline Phosphatase: 425 U/L — ABNORMAL HIGH (ref 38–126)
Anion gap: 25 — ABNORMAL HIGH (ref 5–15)
BUN: 97 mg/dL — ABNORMAL HIGH (ref 8–23)
CO2: 21 mmol/L — ABNORMAL LOW (ref 22–32)
Calcium: 8.8 mg/dL — ABNORMAL LOW (ref 8.9–10.3)
Chloride: 90 mmol/L — ABNORMAL LOW (ref 98–111)
Creatinine, Ser: 4.13 mg/dL — ABNORMAL HIGH (ref 0.44–1.00)
GFR, Estimated: 11 mL/min — ABNORMAL LOW (ref >60)
Glucose, Bld: 125 mg/dL — ABNORMAL HIGH (ref 70–99)
Potassium: 4.7 mmol/L (ref 3.5–5.1)
Sodium: 136 mmol/L (ref 135–145)
Total Bilirubin: 3.8 mg/dL — ABNORMAL HIGH (ref 0.0–1.2)
Total Protein: 5 g/dL — ABNORMAL LOW (ref 6.5–8.1)

## 2023-12-29 LAB — PROTIME-INR
INR: 2 — ABNORMAL HIGH (ref 0.8–1.2)
Prothrombin Time: 23.2 s — ABNORMAL HIGH (ref 11.4–15.2)

## 2023-12-29 LAB — I-STAT CG4 LACTIC ACID, ED
Lactic Acid, Venous: 4.7 mmol/L (ref 0.5–1.9)
Lactic Acid, Venous: 4.3 mmol/L (ref 0.5–1.9)

## 2023-12-29 LAB — CREATININE, SERUM
Creatinine, Ser: 3.74 mg/dL — ABNORMAL HIGH (ref 0.44–1.00)
GFR, Estimated: 12 mL/min — ABNORMAL LOW (ref >60)

## 2023-12-29 LAB — PROCALCITONIN: Procalcitonin: 3.32 ng/mL

## 2023-12-29 LAB — C-REACTIVE PROTEIN: CRP: 0.8 mg/dL (ref <1.0)

## 2023-12-29 LAB — CK: Total CK: 1496 U/L — ABNORMAL HIGH (ref 38–234)

## 2023-12-29 LAB — ECHOCARDIOGRAM COMPLETE
Area-P 1/2: 3.07 cm2
S' Lateral: 2.1 cm

## 2023-12-29 LAB — TROPONIN I (HIGH SENSITIVITY)
Troponin I (High Sensitivity): 2223 ng/L (ref <18)
Troponin I (High Sensitivity): 1704 ng/L (ref <18)

## 2023-12-29 LAB — HEPARIN LEVEL (UNFRACTIONATED): Heparin Unfractionated: 0.15 [IU]/mL — ABNORMAL LOW (ref 0.30–0.70)

## 2023-12-29 MED ORDER — LACTATED RINGERS IV SOLN
INTRAVENOUS | Status: DC
Start: 2023-12-29 — End: 2023-12-29

## 2023-12-29 MED ORDER — VANCOMYCIN HCL IN DEXTROSE 1-5 GM/200ML-% IV SOLN
1000.0000 mg | Freq: Once | INTRAVENOUS | Status: AC
  Administered 2023-12-29: 1000 mg via INTRAVENOUS
  Filled 2023-12-29: qty 200

## 2023-12-29 MED ORDER — HEPARIN (PORCINE) 25000 UT/250ML-% IV SOLN: 1500.0000 [IU]/h | INTRAVENOUS | Status: DC

## 2023-12-29 MED ORDER — HEPARIN SODIUM (PORCINE) 5000 UNIT/ML IJ SOLN
5000.0000 [IU] | Freq: Three times a day (TID) | INTRAMUSCULAR | Status: DC
  Administered 2023-12-29 – 2023-12-31 (×6): 5000 [IU] via SUBCUTANEOUS
  Filled 2023-12-29 (×7): qty 1

## 2023-12-29 MED ORDER — OXYCODONE HCL 5 MG PO TABS: 5.0000 mg | ORAL_TABLET | ORAL | Status: DC | PRN

## 2023-12-29 MED ORDER — ACETAMINOPHEN 650 MG RE SUPP: 650.0000 mg | Freq: Four times a day (QID) | RECTAL | Status: DC | PRN

## 2023-12-29 MED ORDER — PROMETHAZINE HCL 25 MG PO TABS: 12.5000 mg | ORAL_TABLET | Freq: Four times a day (QID) | ORAL | Status: DC | PRN

## 2023-12-29 MED ORDER — LACTATED RINGERS IV BOLUS (SEPSIS)
1000.0000 mL | Freq: Once | INTRAVENOUS | Status: AC
  Administered 2023-12-29: 1000 mL via INTRAVENOUS

## 2023-12-29 MED ORDER — PERFLUTREN LIPID MICROSPHERE
1.0000 mL | INTRAVENOUS | Status: AC | PRN
  Administered 2023-12-29: 3 mL via INTRAVENOUS

## 2023-12-29 MED ORDER — SODIUM CHLORIDE 0.9 % IV SOLN
1.0000 g | INTRAVENOUS | Status: DC
  Administered 2023-12-30 – 2023-12-31 (×2): 1 g via INTRAVENOUS
  Filled 2023-12-29 (×2): qty 10

## 2023-12-29 MED ORDER — ASPIRIN 81 MG PO TBEC
81.0000 mg | DELAYED_RELEASE_TABLET | Freq: Every day | ORAL | Status: DC
  Administered 2023-12-30 – 2023-12-31 (×2): 81 mg via ORAL
  Filled 2023-12-29 (×2): qty 1

## 2023-12-29 MED ORDER — ACETAMINOPHEN 325 MG PO TABS
650.0000 mg | ORAL_TABLET | Freq: Four times a day (QID) | ORAL | Status: DC | PRN
  Administered 2023-12-30: 650 mg via ORAL
  Filled 2023-12-29: qty 2

## 2023-12-29 MED ORDER — SODIUM CHLORIDE 0.9 % IV SOLN
2.0000 g | Freq: Once | INTRAVENOUS | Status: AC
  Administered 2023-12-29: 2 g via INTRAVENOUS
  Filled 2023-12-29: qty 12.5

## 2023-12-29 MED ORDER — LACTATED RINGERS IV BOLUS
1000.0000 mL | Freq: Once | INTRAVENOUS | Status: AC
  Administered 2023-12-29: 1000 mL via INTRAVENOUS

## 2023-12-29 MED ORDER — METRONIDAZOLE 500 MG/100ML IV SOLN
500.0000 mg | Freq: Once | INTRAVENOUS | Status: AC
  Administered 2023-12-29: 500 mg via INTRAVENOUS
  Filled 2023-12-29: qty 100

## 2023-12-29 MED ORDER — BISACODYL 5 MG PO TBEC: 5.0000 mg | DELAYED_RELEASE_TABLET | Freq: Every day | ORAL | Status: DC | PRN

## 2023-12-29 MED ORDER — ASPIRIN 81 MG PO CHEW
324.0000 mg | CHEWABLE_TABLET | Freq: Once | ORAL | Status: AC
  Administered 2023-12-29: 324 mg via ORAL
  Filled 2023-12-29: qty 4

## 2023-12-29 MED ORDER — HEPARIN BOLUS VIA INFUSION
4000.0000 [IU] | Freq: Once | INTRAVENOUS | Status: DC
  Filled 2023-12-29: qty 4000

## 2023-12-29 NOTE — ED Triage Notes (Signed)
 Per EMS, Pt, from home, presents after being found on the floor.  Pt's family called for a wellness check because they hadn't heard from her in 6 days.  Pt reports she rolled out of bed x2 days ago and couldn't get up.  Pt reports her legs are weak and she walks w/ a walker.   Pt noted to have 3+ pitting edema in BLEs.  Pt reports this has been going on x2 months.

## 2023-12-29 NOTE — H&P (Addendum)
 [facility] H&P   Patient Demographics:    [name], is a 73 y.o. female  MRN: [id number]   DOB - Feb 19, 1951  Admit Date - 12/29/2023  Outpatient Primary MD for the patient is Pcp, No  Patient coming from: Home  Chief Complaint  Patient presents with   Fall      HPI:    [name]  is a 73 y.o. female, who is in relatively good health except for arthritis, poor balance uses walker, history of colon cancer 9 years ago which was treated with resection and has resolved since then, morbid obesity, last medical checkup 2 years ago and it was normal, on no home medications who lives alone, who fell out of the bed while getting about 2 days ago, she was subsequently unable to get up, neighbors did a welfare check on her and found her on the floor.  EMS was called and she was brought to the ER.  In the ER patient was relatively symptom-free except she was extremely tired and weak, head CT was nonacute, she had no headache or focal deficits, no subjective complaints, workup was suggestive of severe dehydration, hypotension, rhabdomyolysis, AKI, elevated troponin nonspecific EKG changes.  Cardiology was consulted by the ER physician cardiology recommended medical management and an echocardiogram, I was called to admit the patient.  Patient is currently absolutely symptom-free except for some fatigue and tiredness, denies any headache, no chest pain or palpitations, no fever chills no cough, no abdominal pain, no diarrhea or dysuria or focal weakness.  No joint pains or aches.  Incidentally COVID-19 test done in the ER has come back positive.    Review of systems:    A full 10 point Review of Systems was done, except as  stated above, all other Review of Systems were negative.   With Past History of the following :    Past Medical History:  Diagnosis Date   Asthma    colon ca dx'd 03/2007   surg only   Hypertension       Past Surgical History:  Procedure Laterality Date   COLECTOMY     TUBAL LIGATION        Social History:     Social History   Tobacco Use   Smoking status: Former    Types: Cigarettes    Passive  exposure: Never   Smokeless tobacco: Never  Substance Use Topics   Alcohol use: Never        Family History :  No family history of DM type II   Home Medications:   Prior to Admission medications   Not on File     Allergies:    No Known Allergies   Physical Exam:   Vitals  Blood pressure 124/88, pulse 93, temperature (!) 96.6 F (35.9 C), temperature source Rectal, resp. rate 18, SpO2 100%.   1. General Morbidly obese African-American female middle-aged lying in hospital bed in no discomfort whatsoever,  2. Normal affect and insight, Not Suicidal or Homicidal, Awake Alert,   3. No F.N deficits, ALL C.Nerves Intact, Strength 5/5 all 4 extremities, Sensation intact all 4 extremities, Plantars down going.  4. Ears and Eyes appear Normal, Conjunctivae clear, PERRLA. Moist Oral Mucosa.  5. Supple Neck, No JVD, No cervical lymphadenopathy appriciated, No Carotid Bruits.  6. Symmetrical Chest wall movement, Good air movement bilaterally, CTAB.  7. RRR, No Gallops, Rubs or Murmurs, No Parasternal Heave.  8. Positive Bowel Sounds, Abdomen Soft, No tenderness, No organomegaly appriciated,No rebound -guarding or rigidity.  9.  No Cyanosis, Normal Skin Turgor, No Skin Rash or Bruise.  10. Good muscle tone,  joints appear normal , no effusions, Normal ROM.  11. No Palpable Lymph Nodes in Neck or Axillae     Data Review:   Recent Labs  Lab 12/29/23 1400 12/29/23 1420  WBC 10.5  --   HGB 20.2* 20.4*  HCT 57.2* 60.0*  PLT 469*  --   MCV 86.8  --   MCH  30.7  --   MCHC 35.3  --   RDW 19.4*  --   LYMPHSABS 1.6  --   MONOABS 0.4  --   EOSABS 0.0  --   BASOSABS 0.0  --     Recent Labs  Lab 12/29/23 1400 12/29/23 1420  NA 136 134*  K 4.7 5.0  CL 90* 104  CO2 21*  --   ANIONGAP 25*  --   GLUCOSE 125* 124*  BUN 97* 108*  CREATININE 4.13* 4.50*  AST 123*  --   ALT 54*  --   ALKPHOS 425*  --   BILITOT 3.8*  --   ALBUMIN <1.5*  --   LATICACIDVEN  --  4.7*  CALCIUM 8.8*  --     No results found for: CHOL, HDL, LDLCALC, LDLDIRECT, TRIG, CHOLHDL  Recent Labs  Lab 12/29/23 1400 12/29/23 1420  LATICACIDVEN  --  4.7*  CALCIUM 8.8*  --     Recent Labs  Lab 12/29/23 1400 12/29/23 1420  WBC 10.5  --   PLT 469*  --   LATICACIDVEN  --  4.7*  CREATININE 4.13* 4.50*    Urinalysis    Component Value Date/Time   COLORURINE AMBER (A) 12/29/2023 1400   APPEARANCEUR CLOUDY (A) 12/29/2023 1400   LABSPEC 1.029 12/29/2023 1400   PHURINE 5.0 12/29/2023 1400   GLUCOSEU 50 (A) 12/29/2023 1400   HGBUR MODERATE (A) 12/29/2023 1400   BILIRUBINUR MODERATE (A) 12/29/2023 1400   KETONESUR NEGATIVE 12/29/2023 1400   PROTEINUR >=300 (A) 12/29/2023 1400   NITRITE NEGATIVE 12/29/2023 1400   LEUKOCYTESUR NEGATIVE 12/29/2023 1400      Imaging Results:    CT Cervical Spine Wo Contrast Result Date: 12/29/2023 CLINICAL DATA:  Clemens out of bed 2 days ago, found down EXAM: CT CERVICAL SPINE WITHOUT CONTRAST TECHNIQUE: Multidetector  CT imaging of the cervical spine was performed without intravenous contrast. Multiplanar CT image reconstructions were also generated. RADIATION DOSE REDUCTION: This exam was performed according to the departmental dose-optimization program which includes automated exposure control, adjustment of the mA and/or kV according to patient size and/or use of iterative reconstruction technique. COMPARISON:  None Available. FINDINGS: Alignment: Mild right convex curvature at the cervicothoracic junction. Minimal  degenerative anterolisthesis of C7 on T1. Skull base and vertebrae: No acute fracture. No primary bone lesion or focal pathologic process. Soft tissues and spinal canal: No prevertebral fluid or swelling. No visible canal hematoma. Disc levels: Multilevel cervical spondylosis most pronounced at C4-5, C5-6, and C6-7. Lower cervical facet hypertrophic changes greatest at C7-T1. Upper chest: Airway is patent.  Lung apices are clear. Other: Reconstructed images demonstrate no additional findings. IMPRESSION: 1. No acute cervical spine fracture. 2. Multilevel lower cervical spondylosis and facet hypertrophy as above. Electronically Signed   By: Ozell Daring M.D.   On: 12/29/2023 15:04   CT Head Wo Contrast Result Date: 12/29/2023 CLINICAL DATA:  Altered level of consciousness, fell out of bed, found down EXAM: CT HEAD WITHOUT CONTRAST TECHNIQUE: Contiguous axial images were obtained from the base of the skull through the vertex without intravenous contrast. RADIATION DOSE REDUCTION: This exam was performed according to the departmental dose-optimization program which includes automated exposure control, adjustment of the mA and/or kV according to patient size and/or use of iterative reconstruction technique. COMPARISON:  None Available. FINDINGS: Brain: Focal hypodensities left basal ganglia with associated ex vacuo dilatation of the left lateral ventricle consistent with chronic lacunar infarcts. Focal hypodensity in the right thalamus as well as mild hypodensity in the left frontal periventricular white matter compatible with age-indeterminate small vessel ischemic changes, likely chronic. No other signs of acute infarct or hemorrhage. Lateral ventricles and midline structures are otherwise unremarkable. No acute extra-axial fluid collections. No mass effect. Vascular: No hyperdense vessel or unexpected calcification. Skull: Normal. Negative for fracture or focal lesion. Sinuses/Orbits: No acute finding. Other:  None. IMPRESSION: 1. Chronic appearing small vessel ischemic changes within the left basal ganglia, right thalamus, and left frontal periventricular white matter. 2. No acute intracranial trauma. Electronically Signed   By: Ozell Daring M.D.   On: 12/29/2023 15:02    My personal review of EKG: Rhythm NSR, nonspecific ST change   Assessment & Plan:    1.  Mechanical fall, down on the floor for 2 days, exposure to elements, dehydration, hypotension, rhabdomyolysis, AKI.  She will be admitted to progressive care unit, she is extremely dehydrated with hypoperfusion and elevated lactic acid levels, no headache, no focal deficits, no palpitations or chest pain, CT head and C-spine unremarkable, clinically no infection or sepsis, she will be aggressively hydrated with IV fluids, monitor renal function, PT eval may require placement.  2.  Severe dehydration, hypotension, elevated lactic acid, hypoperfusion related some demand ischemia and elevated troponin.  All due to above, her case was discussed by ED physician by the cardiologist on-call Dr. Delford who recommends no intervention at this time and to check echocardiogram, echocardiogram ordered, patient chest pain-free, EKG nonacute, placed on aspirin trend troponin follow echo report.  Hydrate with IV fluids as above  3.  Morbid obesity, BMI greater than 40, decreased mobility.  PT eval may require placement.  Follow-up with PCP for weight loss.  4.  COVID-19 positive.  Likely incidental.  No cough or fever, supportive care, check CRP and Pro-Cal, check chest x-ray, airborne isolation  and monitor.  5.  Relative polycythemia.  Due to hemoconcentration, hydrate and repeat CBC in the morning.  6.  Possible UTI.  UA obtained in the ER is borderline, no dysuria, 3 days of Rocephin.  Clinically no sepsis.    DVT Prophylaxis Heparin   AM Labs Ordered, also please review Full Orders  Family Communication: Admission, patients condition and plan of care  including tests being ordered have been discussed with the patient and family friend at bedside who indicate understanding and agree with the plan and Code Status.  Code Status full code  Likely DC to decided  Condition fair  Consults called: Cardiology Dr. Delford called by ER.  Nothing to offer at this time  Admission status: Inpatient  Time spent in minutes : 35  Signature  -    Lavada Stank M.D on 12/29/2023 at 4:04 PM   -  To page go to [email or web address]

## 2023-12-29 NOTE — ED Notes (Signed)
 EDP aware we were unable to get 2nd set of cultures.

## 2023-12-29 NOTE — ED Notes (Signed)
 New Blue, Gold, and [employer] tubes sent to lab.

## 2023-12-29 NOTE — Consult Note (Signed)
 CARDIOLOGY CONSULT NOTE       Patient ID: [name] MRN: [id number] DOB/AGE: March 30, 1950 73 y.o.  Admit date: 12/29/2023 Referring Physician: Freddi Primary Physician: Pcp, No Primary Cardiologist: None Reason for Consultation: Elevated troponin  Principal Problem:   AKI (acute kidney injury) Active Problems:   Elevated troponin   HPI:  73 y.o. history of HTN, asthma, colon cancer. Lives alone and fell being on floor for ? 2 days. Admitted with dehydration, AKI, and likely rhabdomyolysis. Lactic Acid 4.7  Cr 4.5, BUN 97. Hemo concentrated Hct 57.2 Urine with amber color and moderate Hb. Troponin 2223-> 1704 but in setting of elevated CPK 1496.  She has no chest pain. She is morbidly obese and deconditioned. ECG showed NSR LAD and poor R wave progression. TTE reviewed by myself shows EF 55-60% severe LVH no RWMA;s normal RV no significant valve dx and no effusion. She is being hydrated in ER.   ROS All other systems reviewed and negative except as noted above  Past Medical History:  Diagnosis Date   Asthma    colon ca dx'd 03/2007   surg only   Hypertension     History reviewed. No pertinent family history.  Social History   Socioeconomic History   Marital status: Single    Spouse name: Not on file   Number of children: Not on file   Years of education: Not on file   Highest education level: Not on file  Occupational History   Not on file  Tobacco Use   Smoking status: Former    Types: Cigarettes    Passive exposure: Never   Smokeless tobacco: Never  Vaping Use   Vaping status: Never Used  Substance and Sexual Activity   Alcohol use: Never   Drug use: Never   Sexual activity: Not on file  Other Topics Concern   Not on file  Social History Narrative   Not on file   Social Drivers of Health   Financial Resource Strain: Not on file  Food Insecurity: Not on file  Transportation Needs: Not on file  Physical Activity: Not on file  Stress: Not on file   Social Connections: Not on file  Intimate Partner Violence: Not on file    Past Surgical History:  Procedure Laterality Date   COLECTOMY     TUBAL LIGATION        Current Facility-Administered Medications:    acetaminophen (TYLENOL) tablet 650 mg, 650 mg, Oral, Q6H PRN **OR** acetaminophen (TYLENOL) suppository 650 mg, 650 mg, Rectal, Q6H PRN, Singh, Prashant K, MD   [START ON 12/30/2023] aspirin EC tablet 81 mg, 81 mg, Oral, Daily, Singh, Prashant K, MD   bisacodyl (DULCOLAX) EC tablet 5 mg, 5 mg, Oral, Daily PRN, Singh, Prashant K, MD   [START ON 12/30/2023] cefTRIAXone (ROCEPHIN) 1 g in sodium chloride 0.9 % 100 mL IVPB, 1 g, Intravenous, Q24H, Singh, Prashant K, MD   heparin injection 5,000 Units, 5,000 Units, Subcutaneous, Q8H, Singh, Prashant K, MD, 5,000 Units at 12/29/23 1609   oxyCODONE (Oxy IR/ROXICODONE) immediate release tablet 5 mg, 5 mg, Oral, Q4H PRN, Singh, Prashant K, MD   perflutren lipid microspheres (DEFINITY) IV suspension, 1-10 mL, Intravenous, PRN, Syra Sirmons C, MD, 3 mL at 12/29/23 1610   promethazine (PHENERGAN) tablet 12.5 mg, 12.5 mg, Oral, Q6H PRN, Singh, Prashant K, MD  Current Outpatient Medications:    Cholecalciferol (VITAMIN D3 PO), Take 2 each by mouth daily. Gummies, Disp: , Rfl:    Cyanocobalamin (VITAMIN  B12 PO), Take 1 tablet by mouth daily., Disp: , Rfl:    Multiple Vitamins-Minerals (WOMENS MULTIVITAMIN PO), Take 1 tablet by mouth daily., Disp: , Rfl:   [START ON 12/30/2023] aspirin EC  81 mg Oral Daily   heparin  5,000 Units Subcutaneous Q8H    [START ON 12/30/2023] cefTRIAXone (ROCEPHIN)  IV      Physical Exam: Blood pressure 110/75, pulse 91, temperature (!) 96.6 F (35.9 C), temperature source Rectal, resp. rate 19, SpO2 100%.    Obese black female Lungs clear Distant heart sounds Abdomen benign Plus one bilateral edema  Labs:   Lab Results  Component Value Date   WBC 10.5 12/29/2023   HGB 20.4 (H) 12/29/2023   HCT 60.0 (H)  12/29/2023   MCV 86.8 12/29/2023   PLT 469 (H) 12/29/2023    Recent Labs  Lab 12/29/23 1400 12/29/23 1420 12/29/23 1548  NA 136 134*  --   K 4.7 5.0  --   CL 90* 104  --   CO2 21*  --   --   BUN 97* 108*  --   CREATININE 4.13* 4.50* 3.74*  CALCIUM 8.8*  --   --   PROT 5.0*  --   --   BILITOT 3.8*  --   --   ALKPHOS 425*  --   --   ALT 54*  --   --   AST 123*  --   --   GLUCOSE 125* 124*  --    Lab Results  Component Value Date   CKTOTAL 1,496 (H) 12/29/2023   No results found for: CHOL No results found for: HDL No results found for: LDLCALC No results found for: TRIG No results found for: CHOLHDL No results found for: LDLDIRECT    Radiology: ECHOCARDIOGRAM COMPLETE Result Date: 12/29/2023    ECHOCARDIOGRAM REPORT   Patient Name:   [name] Date of Exam: 12/29/2023 Medical Rec #:  [id number]        Height:       65.0 in Accession #:    [id number]       Weight:       260.0 lb Date of Birth:  1951-01-05         BSA:          2.211 m Patient Age:    73 years         BP:           124/88 mmHg Patient Gender: F                HR:           89 bpm. Exam Location:  Inpatient Procedure: 2D Echo, Cardiac Doppler and Color Doppler (Both Spectral and Color            Flow Doppler were utilized during procedure). Indications:    Elevated Troponin  History:        Patient has no prior history of Echocardiogram examinations.  Sonographer:    Damien Senior RDCS Referring Phys: MAUDE JAYSON EMMER  Sonographer Comments: Technically difficult study due to patient body habitus. IMPRESSIONS  1. Left ventricular ejection fraction, by estimation, is 55 to 60%. The left ventricle has normal function. The left ventricle has no regional wall motion abnormalities. There is severe concentric left ventricular hypertrophy. Left ventricular diastolic  parameters are consistent with Grade I diastolic dysfunction (impaired relaxation).  2. Right ventricular systolic function is normal. The right  ventricular size is not well visualized.  Tricuspid regurgitation signal is inadequate for assessing PA pressure.  3. The mitral valve is grossly normal. Trivial mitral valve regurgitation.  4. The aortic valve is tricuspid. Aortic valve regurgitation is not visualized. Aortic valve sclerosis is present, with no evidence of aortic valve stenosis.  5. The inferior vena cava is normal in size with greater than 50% respiratory variability, suggesting right atrial pressure of 3 mmHg. FINDINGS  Left Ventricle: Left ventricular ejection fraction, by estimation, is 55 to 60%. The left ventricle has normal function. The left ventricle has no regional wall motion abnormalities. Definity contrast agent was given IV to delineate the left ventricular  endocardial borders. Strain was performed and the global longitudinal strain is indeterminate. The left ventricular internal cavity size was normal in size. There is severe concentric left ventricular hypertrophy. Left ventricular diastolic parameters are consistent with Grade I diastolic dysfunction (impaired relaxation). Right Ventricle: The right ventricular size is not well visualized. Right vetricular wall thickness was not well visualized. Right ventricular systolic function is normal. Tricuspid regurgitation signal is inadequate for assessing PA pressure. Left Atrium: Left atrial size was normal in size. Right Atrium: Right atrial size was not well visualized. Pericardium: Trivial pericardial effusion is present. Mitral Valve: The mitral valve is grossly normal. Trivial mitral valve regurgitation. Tricuspid Valve: The tricuspid valve is grossly normal. Tricuspid valve regurgitation is not demonstrated. Aortic Valve: The aortic valve is tricuspid. Aortic valve regurgitation is not visualized. Aortic valve sclerosis is present, with no evidence of aortic valve stenosis. Pulmonic Valve: The pulmonic valve was grossly normal. Pulmonic valve regurgitation is not visualized.  Aorta: The aortic root and ascending aorta are structurally normal, with no evidence of dilitation. Venous: The inferior vena cava is normal in size with greater than 50% respiratory variability, suggesting right atrial pressure of 3 mmHg. IAS/Shunts: No atrial level shunt detected by color flow Doppler. Additional Comments: 3D was performed not requiring image post processing on an independent workstation and was indeterminate.  LEFT VENTRICLE PLAX 2D LVIDd:         3.00 cm   Diastology LVIDs:         2.10 cm   LV e' medial:    3.70 cm/s LV PW:         1.90 cm   LV E/e' medial:  16.3 LV IVS:        1.90 cm   LV e' lateral:   3.92 cm/s LVOT diam:     2.20 cm   LV E/e' lateral: 15.4 LV SV:         77 LV SV Index:   35 LVOT Area:     3.80 cm  RIGHT VENTRICLE RV S prime:     11.60 cm/s TAPSE (M-mode): 1.6 cm LEFT ATRIUM             Index LA diam:        3.00 cm 1.36 cm/m LA Vol (A2C):   44.8 ml 20.26 ml/m LA Vol (A4C):   40.5 ml 18.31 ml/m LA Biplane Vol: 43.9 ml 19.85 ml/m  AORTIC VALVE LVOT Vmax:   141.00 cm/s LVOT Vmean:  108.000 cm/s LVOT VTI:    0.203 m  AORTA Ao Root diam: 2.90 cm Ao Asc diam:  3.50 cm MITRAL VALVE MV Area (PHT): 3.07 cm     SHUNTS MV Decel Time: 247 msec     Systemic VTI:  0.20 m MV E velocity: 60.20 cm/s   Systemic Diam: 2.20 cm MV A velocity:  102.00 cm/s MV E/A ratio:  0.59 Maude Emmer MD Electronically signed by Maude Emmer MD Signature Date/Time: 12/29/2023/4:18:35 PM    Final    DG Chest Port 1 View Result Date: 12/29/2023 CLINICAL DATA:  Sepsis. EXAM: PORTABLE CHEST 1 VIEW COMPARISON:  Chest radiograph dated 04/18/2007. FINDINGS: Minimal left lung base atelectasis. No focal consolidation, pleural effusion or pneumothorax. Thickened right perihilar and paramediastinal tissue of indeterminate etiology. Further evaluation with chest CT is recommended. Stable cardiomegaly. No acute osseous pathology. IMPRESSION: 1. No focal consolidation. 2. Thickened right perihilar and  paramediastinal tissue of indeterminate etiology. Further evaluation with chest CT is recommended. Electronically Signed   By: Vanetta Chou M.D.   On: 12/29/2023 16:13   CT Cervical Spine Wo Contrast Result Date: 12/29/2023 CLINICAL DATA:  Clemens out of bed 2 days ago, found down EXAM: CT CERVICAL SPINE WITHOUT CONTRAST TECHNIQUE: Multidetector CT imaging of the cervical spine was performed without intravenous contrast. Multiplanar CT image reconstructions were also generated. RADIATION DOSE REDUCTION: This exam was performed according to the departmental dose-optimization program which includes automated exposure control, adjustment of the mA and/or kV according to patient size and/or use of iterative reconstruction technique. COMPARISON:  None Available. FINDINGS: Alignment: Mild right convex curvature at the cervicothoracic junction. Minimal degenerative anterolisthesis of C7 on T1. Skull base and vertebrae: No acute fracture. No primary bone lesion or focal pathologic process. Soft tissues and spinal canal: No prevertebral fluid or swelling. No visible canal hematoma. Disc levels: Multilevel cervical spondylosis most pronounced at C4-5, C5-6, and C6-7. Lower cervical facet hypertrophic changes greatest at C7-T1. Upper chest: Airway is patent.  Lung apices are clear. Other: Reconstructed images demonstrate no additional findings. IMPRESSION: 1. No acute cervical spine fracture. 2. Multilevel lower cervical spondylosis and facet hypertrophy as above. Electronically Signed   By: Ozell Daring M.D.   On: 12/29/2023 15:04   CT Head Wo Contrast Result Date: 12/29/2023 CLINICAL DATA:  Altered level of consciousness, fell out of bed, found down EXAM: CT HEAD WITHOUT CONTRAST TECHNIQUE: Contiguous axial images were obtained from the base of the skull through the vertex without intravenous contrast. RADIATION DOSE REDUCTION: This exam was performed according to the departmental dose-optimization program which  includes automated exposure control, adjustment of the mA and/or kV according to patient size and/or use of iterative reconstruction technique. COMPARISON:  None Available. FINDINGS: Brain: Focal hypodensities left basal ganglia with associated ex vacuo dilatation of the left lateral ventricle consistent with chronic lacunar infarcts. Focal hypodensity in the right thalamus as well as mild hypodensity in the left frontal periventricular white matter compatible with age-indeterminate small vessel ischemic changes, likely chronic. No other signs of acute infarct or hemorrhage. Lateral ventricles and midline structures are otherwise unremarkable. No acute extra-axial fluid collections. No mass effect. Vascular: No hyperdense vessel or unexpected calcification. Skull: Normal. Negative for fracture or focal lesion. Sinuses/Orbits: No acute finding. Other: None. IMPRESSION: 1. Chronic appearing small vessel ischemic changes within the left basal ganglia, right thalamus, and left frontal periventricular white matter. 2. No acute intracranial trauma. Electronically Signed   By: Ozell Daring M.D.   On: 12/29/2023 15:02    EKG: SR rate 94 LAD poor R wave progression   ASSESSMENT AND PLAN:   Elevated Troponin:  in context of elevated CPK 1496 and being on floor for at least 2 days likely reflects rhabdomyolysis. TTE with normal EF and no RWMA;s.  No need for further cardiac w/u at this time AKI:  Cr  4.13 with profound azotemia and hemo concentration. Hb in urine amber color with elevated CPK 1496 and lactic acid suggesting rhabdomyolysis. R/O sepsis hydrate follow Cr per primary service  Covid Positive:  no respiratory signs CXR no focal consolidation Airborne isolation per primary service  Cardiology will sign off   Signed: Maude Emmer 12/29/2023, 5:31 PM

## 2023-12-29 NOTE — ED Notes (Signed)
 IV team and ECHO at bedside.

## 2023-12-29 NOTE — Progress Notes (Addendum)
 Addendum: Cancelled  PHARMACY - ANTICOAGULATION CONSULT NOTE  Pharmacy Consult for heparin Indication: chest pain/ACS  No Known Allergies  Patient Measurements:    Vital Signs: Temp: 96.6 F (35.9 C) (11/06 1357) Temp Source: Rectal (11/06 1357) BP: 124/88 (11/06 1515) Pulse Rate: 88 (11/06 1515)  Labs: Recent Labs    12/29/23 1400 12/29/23 1420  HGB 20.2* 20.4*  HCT 57.2* 60.0*  PLT 469*  --   CREATININE 4.13* 4.50*  CKTOTAL 1,496*  --   TROPONINIHS 2,223*  --     CrCl cannot be calculated (Unknown ideal weight.).   Medical History: Past Medical History:  Diagnosis Date   Asthma    colon ca dx'd 03/2007   surg only   Hypertension     Medications:  Infusions:   heparin     lactated ringers     metronidazole 500 mg (12/29/23 1505)   vancomycin      Assessment: Patient is a 73 year old admitted for weakness, found down for 2 days. She is not on anticoagulants PTA. Pharmacy is consulted for heparin initiation for ACS/STEMI.   Hgb and PLT are elevated. No bleeding noted.   Goal of Therapy:  Heparin level 0.3-0.7 units/ml Monitor platelets by anticoagulation protocol: Yes   Plan:  Give 4000 units bolus x 1,then 1500 units/hr  HL in 8 hours  Daily CBC   Brewer Hitchman M Kaianna Dolezal 12/29/2023,3:28 PM

## 2023-12-29 NOTE — ED Provider Notes (Signed)
 [name] Provider Note   CSN: [id number] Arrival date & time: 12/29/23  1324     Patient presents with: [name]   [name] is a 73 y.o. female.   HPI 73 year old female presents after being found on the floor of her home.  EMS reports that patient has not been heard from in 6 days from family.  Lives at home by herself.  911 was called to check on her and first responders found her facedown on the floor near her bed.  The patient was alert awake but disoriented.  Her mental status has improved in transport with EMS.  Patient tells me and told EMS that she fell out of her bed 2 days ago when she rolled out of bed but due to weakness could not get back up.  She normally has to use a walker to walk.  No specific injuries but both of her legs feel heavy.  She has been having leg swelling when asked to both legs for past couple months.  She denies other symptoms such as headache, chest pain, shortness of breath, abdominal pain, etc.  However, the patient is disoriented on arrival to time.  History is somewhat limited due to this.  Prior to Admission medications   Not on File    Allergies: Patient has no known allergies.    Review of Systems  Unable to perform ROS: Mental status change    Updated Vital Signs BP 119/82   Pulse 89   Temp (!) 96.6 F (35.9 C) (Rectal)   Resp 16   SpO2 100%   Physical Exam Vitals and nursing note reviewed.  Constitutional:      Appearance: She is well-developed. She is obese. She is not diaphoretic.  HENT:     Head: Normocephalic and atraumatic.     Mouth/Throat:     Mouth: Mucous membranes are dry.  Eyes:     Extraocular Movements: Extraocular movements intact.     Pupils: Pupils are equal, round, and reactive to light.  Cardiovascular:     Rate and Rhythm: Normal rate and regular rhythm.     Pulses:          Dorsalis pedis pulses are detected w/ Doppler on the right side and detected w/  Doppler on the left side.     Heart sounds: Normal heart sounds.  Pulmonary:     Effort: Pulmonary effort is normal.     Breath sounds: Normal breath sounds. No wheezing.  Abdominal:     General: There is no distension.     Palpations: Abdomen is soft.     Tenderness: There is no abdominal tenderness.  Musculoskeletal:     Right hip: No tenderness.     Left hip: No tenderness.     Right knee: No tenderness.     Left knee: No tenderness.     Right lower leg: Edema present.     Left lower leg: Edema present.     Comments: Pitting edema to bilateral lower extremities.  Both feet are cool to the touch. Patient has limited range of motion of her hips and knees due to the weakness in her legs but when I passively range her hips and knees on both sides there is no pain or tenderness.  Skin:    General: Skin is warm and dry.  Neurological:     Mental Status: She is alert.     Comments: Awake, alert, no slurred speech or  facial droop.  She is disoriented to time but seems to be oriented to person, place, situation.  However she does not know what day it is so it is unclear if she truly fell out of bed 2 days ago. She has normal grip strength in both hands but both legs are weak, about 4/5.     (all labs ordered are listed, but only abnormal results are displayed) Labs Reviewed  COMPREHENSIVE METABOLIC PANEL WITH GFR - Abnormal; Notable for the following components:      Result Value   Chloride 90 (*)    CO2 21 (*)    Glucose, Bld 125 (*)    BUN 97 (*)    Creatinine, Ser 4.13 (*)    Calcium 8.8 (*)    Total Protein 5.0 (*)    Albumin <1.5 (*)    AST 123 (*)    ALT 54 (*)    Alkaline Phosphatase 425 (*)    Total Bilirubin 3.8 (*)    GFR, Estimated 11 (*)    Anion gap 25 (*)    All other components within normal limits  CBC WITH DIFFERENTIAL/PLATELET - Abnormal; Notable for the following components:   RBC 6.59 (*)    Hemoglobin 20.2 (*)    HCT 57.2 (*)    RDW 19.4 (*)     Platelets 469 (*)    Neutro Abs 8.4 (*)    All other components within normal limits  URINALYSIS, W/ REFLEX TO CULTURE (INFECTION SUSPECTED) - Abnormal; Notable for the following components:   Color, Urine AMBER (*)    APPearance CLOUDY (*)    Glucose, UA 50 (*)    Hgb urine dipstick MODERATE (*)    Bilirubin Urine MODERATE (*)    Protein, ur >=300 (*)    Bacteria, UA RARE (*)    All other components within normal limits  CK - Abnormal; Notable for the following components:   Total CK 1,496 (*)    All other components within normal limits  I-STAT CG4 LACTIC ACID, ED - Abnormal; Notable for the following components:   Lactic Acid, Venous 4.7 (*)    All other components within normal limits  I-STAT CHEM 8, ED - Abnormal; Notable for the following components:   Sodium 134 (*)    BUN 108 (*)    Creatinine, Ser 4.50 (*)    Glucose, Bld 124 (*)    Calcium, Ion 0.87 (*)    Hemoglobin 20.4 (*)    HCT 60.0 (*)    All other components within normal limits  TROPONIN I (HIGH SENSITIVITY) - Abnormal; Notable for the following components:   Troponin I (High Sensitivity) 2,223 (*)    All other components within normal limits  RESP PANEL BY RT-PCR (RSV, FLU A&B, COVID)  RVPGX2  CULTURE, BLOOD (ROUTINE X 2)  CULTURE, BLOOD (ROUTINE X 2)  PROTIME-INR  I-STAT CG4 LACTIC ACID, ED  TROPONIN I (HIGH SENSITIVITY)    EKG: EKG Interpretation Date/Time:  Thursday December 29 2023 14:15:21 EST Ventricular Rate:  91 PR Interval:  183 QRS Duration:  106 QT Interval:  360 QTC Calculation: 443 R Axis:   -53  Text Interpretation: Sinus rhythm LAD, consider left anterior fascicular block Anterolateral infarct, recent Confirmed by [name] [id number]) on 12/29/2023 2:17:45 PM  Radiology: CT Cervical Spine Wo Contrast Result Date: 12/29/2023 CLINICAL DATA:  [name] out of bed 2 days ago, found down EXAM: CT CERVICAL SPINE WITHOUT CONTRAST TECHNIQUE: Multidetector CT imaging of the cervical  spine was  performed without intravenous contrast. Multiplanar CT image reconstructions were also generated. RADIATION DOSE REDUCTION: This exam was performed according to the departmental dose-optimization program which includes automated exposure control, adjustment of the mA and/or kV according to patient size and/or use of iterative reconstruction technique. COMPARISON:  None Available. FINDINGS: Alignment: Mild right convex curvature at the cervicothoracic junction. Minimal degenerative anterolisthesis of C7 on T1. Skull base and vertebrae: No acute fracture. No primary bone lesion or focal pathologic process. Soft tissues and spinal canal: No prevertebral fluid or swelling. No visible canal hematoma. Disc levels: Multilevel cervical spondylosis most pronounced at C4-5, C5-6, and C6-7. Lower cervical facet hypertrophic changes greatest at C7-T1. Upper chest: Airway is patent.  Lung apices are clear. Other: Reconstructed images demonstrate no additional findings. IMPRESSION: 1. No acute cervical spine fracture. 2. Multilevel lower cervical spondylosis and facet hypertrophy as above. Electronically Signed   By: Ozell Daring M.D.   On: 12/29/2023 15:04   CT Head Wo Contrast Result Date: 12/29/2023 CLINICAL DATA:  Altered level of consciousness, fell out of bed, found down EXAM: CT HEAD WITHOUT CONTRAST TECHNIQUE: Contiguous axial images were obtained from the base of the skull through the vertex without intravenous contrast. RADIATION DOSE REDUCTION: This exam was performed according to the departmental dose-optimization program which includes automated exposure control, adjustment of the mA and/or kV according to patient size and/or use of iterative reconstruction technique. COMPARISON:  None Available. FINDINGS: Brain: Focal hypodensities left basal ganglia with associated ex vacuo dilatation of the left lateral ventricle consistent with chronic lacunar infarcts. Focal hypodensity in the right thalamus as well as mild  hypodensity in the left frontal periventricular white matter compatible with age-indeterminate small vessel ischemic changes, likely chronic. No other signs of acute infarct or hemorrhage. Lateral ventricles and midline structures are otherwise unremarkable. No acute extra-axial fluid collections. No mass effect. Vascular: No hyperdense vessel or unexpected calcification. Skull: Normal. Negative for fracture or focal lesion. Sinuses/Orbits: No acute finding. Other: None. IMPRESSION: 1. Chronic appearing small vessel ischemic changes within the left basal ganglia, right thalamus, and left frontal periventricular white matter. 2. No acute intracranial trauma. Electronically Signed   By: Ozell Daring M.D.   On: 12/29/2023 15:02     .Critical Care  Performed by: Kristy Hamilton, MD Authorized by: Kristy Hamilton, MD   Critical care provider statement:    Critical care time (minutes):  40   Critical care time was exclusive of:  Separately billable procedures and treating other patients   Critical care was necessary to treat or prevent imminent or life-threatening deterioration of the following conditions:  Cardiac failure, renal failure and shock   Critical care was time spent personally by me on the following activities:  Development of treatment plan with patient or surrogate, discussions with consultants, evaluation of patient's response to treatment, examination of patient, ordering and review of laboratory studies, ordering and review of radiographic studies, ordering and performing treatments and interventions, pulse oximetry, re-evaluation of patient's condition and review of old charts    Medications Ordered in the ED  metroNIDAZOLE (FLAGYL) IVPB 500 mg (500 mg Intravenous New Bag/Given 12/29/23 1505)  vancomycin (VANCOCIN) IVPB 1000 mg/200 mL premix (has no administration in time range)  aspirin chewable tablet 324 mg (has no administration in time range)  lactated ringers bolus 1,000 mL (0  mLs Intravenous Stopped 12/29/23 1444)  ceFEPIme (MAXIPIME) 2 g in sodium chloride 0.9 % 100 mL IVPB (0 g Intravenous Stopped 12/29/23 1503)  lactated ringers bolus 1,000 mL (1,000 mLs Intravenous New Bag/Given 12/29/23 1446)    Clinical Course as of 12/29/23 1523  Thu Dec 29, 2023  1424 Patient's EKG is concerning for some Q waves and subtle ST elevations in the anterior leads.  It is unclear if this is new, but based on her presentation may represent a subacute infarct from a couple days ago when she fell.  However she has continued to deny chest pain or shortness of breath or pressure.  Due to this I do not think this represents acute ischemia/need for a code STEMI.  Will continue supportive care including treatment of possible sepsis. [SG]    Clinical Course User Index [SG] [facility], MD                                 Medical Decision Making Amount and/or Complexity of Data Reviewed Labs: ordered.    Details: Acute kidney injury and lactic acidosis.  Troponin 2000+ Radiology: ordered and independent interpretation performed.    Details: No CHF or head bleed ECG/medicine tests: ordered and independent interpretation performed.    Details: Anterior Q waves  Risk OTC drugs. Prescription drug management. Decision regarding hospitalization.   Patient presents after being found down at home.  Is hypothermic and mildly hypotensive in the 90s.  Started on IV fluids.  Her workup shows a significant AKI with a creatinine of 4.  Has an elevated lactate as well.  Prior to this we had started sepsis protocol given unclear etiology.  Not sure if the lactate is a sepsis problem versus hypoperfusion from poor p.o. intake and hypovolemia.  Either way she is clinically doing a little better and her blood pressure is much more stable.  Her EKG is nonspecific without obvious ST elevations that would correlate with a STEMI but I am concerned this might have been a subacute MI.  Her troponin is  elevated to around 2000, but she is not having any active chest pain or shortness of breath or chest pressure.  I did discuss her case with Dr. Delford of cardiology.  He is going to order a stat echo but for now is advising no heparin and will need reevaluation after the echo.  I discussed all of this with the admitting team, Dr. Dennise, of the hospitalist service.  Otherwise, she is still little disoriented to date but is awake, alert, and able to answer all questions.  No focal neurodeficits.     Final diagnoses:  Acute kidney injury    ED Discharge Orders     None          [facility], MD 12/29/23 1542

## 2023-12-29 NOTE — Progress Notes (Signed)
 Echocardiogram 2D Echocardiogram has been performed.  [name] Tamzin Bertling RDCS 12/29/2023, 4:10 PM

## 2023-12-30 ENCOUNTER — Inpatient Hospital Stay (HOSPITAL_COMMUNITY)

## 2023-12-30 DIAGNOSIS — I959 Hypotension, unspecified: Secondary | ICD-10-CM | POA: Diagnosis not present

## 2023-12-30 DIAGNOSIS — R4182 Altered mental status, unspecified: Secondary | ICD-10-CM | POA: Diagnosis not present

## 2023-12-30 DIAGNOSIS — R579 Shock, unspecified: Secondary | ICD-10-CM

## 2023-12-30 DIAGNOSIS — R5381 Other malaise: Secondary | ICD-10-CM

## 2023-12-30 DIAGNOSIS — U071 COVID-19: Secondary | ICD-10-CM | POA: Diagnosis not present

## 2023-12-30 DIAGNOSIS — N281 Cyst of kidney, acquired: Secondary | ICD-10-CM | POA: Diagnosis not present

## 2023-12-30 DIAGNOSIS — M6282 Rhabdomyolysis: Secondary | ICD-10-CM

## 2023-12-30 DIAGNOSIS — R918 Other nonspecific abnormal finding of lung field: Secondary | ICD-10-CM | POA: Diagnosis not present

## 2023-12-30 DIAGNOSIS — R0602 Shortness of breath: Secondary | ICD-10-CM | POA: Diagnosis not present

## 2023-12-30 DIAGNOSIS — W19XXXA Unspecified fall, initial encounter: Secondary | ICD-10-CM

## 2023-12-30 DIAGNOSIS — N179 Acute kidney failure, unspecified: Secondary | ICD-10-CM | POA: Diagnosis not present

## 2023-12-30 LAB — BLOOD CULTURE ID PANEL (REFLEXED) - BCID2

## 2023-12-30 LAB — BASIC METABOLIC PANEL WITH GFR
Anion gap: 17 — ABNORMAL HIGH (ref 5–15)
BUN: 103 mg/dL — ABNORMAL HIGH (ref 8–23)
CO2: 20 mmol/L — ABNORMAL LOW (ref 22–32)
Calcium: 7.9 mg/dL — ABNORMAL LOW (ref 8.9–10.3)
Chloride: 96 mmol/L — ABNORMAL LOW (ref 98–111)
Creatinine, Ser: 4.21 mg/dL — ABNORMAL HIGH (ref 0.44–1.00)
GFR, Estimated: 11 mL/min — ABNORMAL LOW (ref >60)
Glucose, Bld: 120 mg/dL — ABNORMAL HIGH (ref 70–99)
Potassium: 4.8 mmol/L (ref 3.5–5.1)
Sodium: 133 mmol/L — ABNORMAL LOW (ref 135–145)

## 2023-12-30 LAB — MAGNESIUM: Magnesium: 2.4 mg/dL (ref 1.7–2.4)

## 2023-12-30 LAB — C-REACTIVE PROTEIN: CRP: 1.1 mg/dL — ABNORMAL HIGH (ref <1.0)

## 2023-12-30 LAB — T4, FREE: Free T4: 1.23 ng/dL — ABNORMAL HIGH (ref 0.61–1.12)

## 2023-12-30 LAB — COMPREHENSIVE METABOLIC PANEL WITH GFR
ALT: 50 U/L — ABNORMAL HIGH (ref 0–44)
AST: 107 U/L — ABNORMAL HIGH (ref 15–41)
Albumin: <1.5 g/dL — ABNORMAL LOW (ref 3.5–5.0)
Alkaline Phosphatase: 347 U/L — ABNORMAL HIGH (ref 38–126)
Anion gap: 19 — ABNORMAL HIGH (ref 5–15)
BUN: 102 mg/dL — ABNORMAL HIGH (ref 8–23)
CO2: 20 mmol/L — ABNORMAL LOW (ref 22–32)
Calcium: 8 mg/dL — ABNORMAL LOW (ref 8.9–10.3)
Chloride: 96 mmol/L — ABNORMAL LOW (ref 98–111)
Creatinine, Ser: 3.94 mg/dL — ABNORMAL HIGH (ref 0.44–1.00)
GFR, Estimated: 11 mL/min — ABNORMAL LOW (ref >60)
Glucose, Bld: 118 mg/dL — ABNORMAL HIGH (ref 70–99)
Potassium: 4.8 mmol/L (ref 3.5–5.1)
Sodium: 135 mmol/L (ref 135–145)
Total Bilirubin: 3.2 mg/dL — ABNORMAL HIGH (ref 0.0–1.2)
Total Protein: 4.2 g/dL — ABNORMAL LOW (ref 6.5–8.1)

## 2023-12-30 LAB — TSH: TSH: 9.544 u[IU]/mL — ABNORMAL HIGH (ref 0.350–4.500)

## 2023-12-30 LAB — LIPID PANEL
Cholesterol: >1000 mg/dL — ABNORMAL HIGH (ref 0–200)
HDL: 23 mg/dL — ABNORMAL LOW (ref >40)
LDL Cholesterol: UNDETERMINED mg/dL (ref 0–99)
Triglycerides: 648 mg/dL — ABNORMAL HIGH (ref <150)
VLDL: UNDETERMINED mg/dL (ref 0–40)

## 2023-12-30 LAB — LACTIC ACID, PLASMA
Lactic Acid, Venous: 2.9 mmol/L (ref 0.5–1.9)
Lactic Acid, Venous: 3.5 mmol/L (ref 0.5–1.9)

## 2023-12-30 LAB — GLUCOSE, CAPILLARY: Glucose-Capillary: 107 mg/dL — ABNORMAL HIGH (ref 70–99)

## 2023-12-30 LAB — PROCALCITONIN: Procalcitonin: 3.85 ng/mL

## 2023-12-30 LAB — CORTISOL: Cortisol, Plasma: 27.3 ug/dL

## 2023-12-30 LAB — VITAMIN B12: Vitamin B-12: >4000 pg/mL — ABNORMAL HIGH (ref 180–914)

## 2023-12-30 LAB — BRAIN NATRIURETIC PEPTIDE: B Natriuretic Peptide: >4500 pg/mL — ABNORMAL HIGH (ref 0.0–100.0)

## 2023-12-30 LAB — PHOSPHORUS: Phosphorus: 9.1 mg/dL — ABNORMAL HIGH (ref 2.5–4.6)

## 2023-12-30 LAB — CK: Total CK: 813 U/L — ABNORMAL HIGH (ref 38–234)

## 2023-12-30 MED ORDER — FENOFIBRATE 160 MG PO TABS
160.0000 mg | ORAL_TABLET | Freq: Every day | ORAL | Status: DC
  Administered 2023-12-30 – 2023-12-31 (×2): 160 mg via ORAL
  Filled 2023-12-30 (×2): qty 1

## 2023-12-30 MED ORDER — LACTATED RINGERS IV BOLUS
1000.0000 mL | Freq: Once | INTRAVENOUS | Status: AC
  Administered 2023-12-30: 1000 mL via INTRAVENOUS

## 2023-12-30 MED ORDER — MIDODRINE HCL 5 MG PO TABS
10.0000 mg | ORAL_TABLET | Freq: Three times a day (TID) | ORAL | Status: DC
  Administered 2023-12-30 (×4): 10 mg via ORAL
  Filled 2023-12-30 (×4): qty 2

## 2023-12-30 MED ORDER — CHLORHEXIDINE GLUCONATE CLOTH 2 % EX PADS
6.0000 | MEDICATED_PAD | Freq: Every day | CUTANEOUS | Status: DC
  Administered 2023-12-30 – 2023-12-31 (×2): 6 via TOPICAL

## 2023-12-30 MED ORDER — LACTATED RINGERS IV BOLUS (SEPSIS): 1000.0000 mL | Freq: Once | INTRAVENOUS | Status: DC

## 2023-12-30 MED ORDER — SODIUM CHLORIDE 0.9 % IV BOLUS
250.0000 mL | Freq: Once | INTRAVENOUS | Status: AC
  Administered 2023-12-30: 250 mL via INTRAVENOUS

## 2023-12-30 MED ORDER — METRONIDAZOLE 500 MG PO TABS
500.0000 mg | ORAL_TABLET | Freq: Three times a day (TID) | ORAL | Status: DC
  Administered 2023-12-30 – 2023-12-31 (×5): 500 mg via ORAL
  Filled 2023-12-30 (×5): qty 1

## 2023-12-30 MED ORDER — ALBUMIN HUMAN 25 % IV SOLN
25.0000 g | Freq: Four times a day (QID) | INTRAVENOUS | Status: AC
  Administered 2023-12-30 – 2023-12-31 (×4): 25 g via INTRAVENOUS
  Filled 2023-12-30 (×4): qty 100

## 2023-12-30 MED ORDER — LACTATED RINGERS IV SOLN: INTRAVENOUS | Status: AC

## 2023-12-30 MED ORDER — ROSUVASTATIN CALCIUM 10 MG PO TABS
10.0000 mg | ORAL_TABLET | Freq: Every day | ORAL | Status: DC
  Administered 2023-12-31: 10 mg via ORAL
  Filled 2023-12-30: qty 1

## 2023-12-30 MED ORDER — ALBUMIN HUMAN 25 % IV SOLN
50.0000 g | Freq: Once | INTRAVENOUS | Status: AC
  Administered 2023-12-30: 50 g via INTRAVENOUS
  Filled 2023-12-30: qty 200

## 2023-12-30 NOTE — Significant Event (Signed)
 Rapid Response Event Note   Reason for Call :  Hypotension, AMS.  Pt has received 2 250cc boluses and 10mg  midodrine for her soft BPs tonight.   Pt had short episode of staring off to her L and not responding to RN. After episode, pt confused with delayed responses.  Initial Focused Assessment:  Pt lying in bed with eyes open, in no visible distress. She denies dizziness/SOB/CP. She is alert and oriented to self, place, and situation. During conversation pt has intermittent episodes of confusion where she seems to have difficulty finding her words. NIH-8. Other times, she is able to speak clear and completely.   HR-88, BP-104/68, RR-16, SpO2-95% on RA  Interventions:  CBG-107 CT head Plan of Care:  BP better after bolus. Obtain CT head. Continue to monitor pt. Please call RRT if further assistance needed.   Event Summary:   MD Notified: Dr. Vanessa to bedside to assess, Dr. Lawence notified by bedside RN.  Call Upfz:[phone] Arrival Time:0250 End Time:  Tish Graeme Piety, RN

## 2023-12-30 NOTE — Progress Notes (Signed)
 PROGRESS NOTE                                                                                                                                                                                                             Patient Demographics:    [name], is a 73 y.o. female, DOB - 1950/03/29, FMW:[id number]  Outpatient Primary MD for the patient is Pcp, No    LOS - 1  Admit date - 12/29/2023    Chief Complaint  Patient presents with   Fall       Brief Narrative (HPI from H&P)   73 y.o. female, who is in relatively good health except for arthritis, poor balance uses walker, history of colon cancer 9 years ago which was treated with resection and has resolved since then, morbid obesity, last medical checkup 2 years ago and it was normal, on no home medications who lives alone, who fell out of the bed while getting about 2 days ago, she was subsequently unable to get up, neighbors did a welfare check on her and found her on the floor.  EMS was called and she was brought to the ER.   In the ER patient was relatively symptom-free except she was extremely tired and weak, head CT was nonacute, she had no headache or focal deficits, no subjective complaints, workup was suggestive of severe dehydration, hypotension, rhabdomyolysis, AKI, elevated troponin nonspecific EKG changes.  Cardiology was consulted by the ER physician cardiology recommended medical management and an echocardiogram, I was called to admit the patient.   Patient is currently absolutely symptom-free except for some fatigue and tiredness, denies any headache, no chest pain or palpitations, no fever chills no cough, no abdominal pain, no diarrhea or dysuria or focal weakness.  No joint pains or aches.  Incidentally COVID-19 test done in the ER has come back positive.   Subjective:    [name] today has, No headache, No chest pain, No abdominal pain - No  Nausea, No new weakness tingling or numbness, no shortness of breath.  She realizes that she is minimally confused.   Assessment  & Plan :   1.  Mechanical fall, down on the floor for 2 days, exposure to elements, dehydration, hypotension, rhabdomyolysis, AKI.  She will be admitted to progressive care unit, she is extremely dehydrated with hypoperfusion and  elevated lactic acid levels, no headache, no focal deficits, no palpitations or chest pain, CT head and C-spine unremarkable, clinically no infection or sepsis, she will be aggressively hydrated with IV fluids, echo appears satisfactory, commence PT, renal function and dehydration gradually improving.   2.  Severe dehydration, hypotension, elevated lactic acid, hypoperfusion related some demand ischemia and elevated troponin.  All due to above, seen by cardiology, echo satisfactory with preserved EF and no wall motion abnormality, likely mild demand ischemia from hypotension, on aspirin, chest pain-free, EKG stable.  No further workup for troponin elevation.  Trend is stable.   3.  Morbid obesity, BMI greater than 40, decreased mobility.  PT eval may require placement.  Follow-up with PCP for weight loss.   4.  COVID-19 positive.  Likely incidental.  No cough or fever, supportive care, check CRP and Pro-Cal, new airborne and contact isolation, initial chest x-ray stable, now developing left lower lobe infiltrate which could be atelectasis versus some aspiration when she was on the floor for 2 days, on empiric antibiotics, speech therapy to evaluate, afebrile, stable CRP, no cough shortness of breath or any other pulmonary symptoms at this time.   5.  Relative polycythemia.  Due to hemoconcentration, improving with hydration.   6.  Possible UTI.  UA obtained in the ER is borderline, no dysuria, antibiotics covering for pneumonia above and UTI.  Monitor.  7.  Mild delirium.  Combination of dehydration, advanced age, being in the hospital and likely  due to incidental COVID causing minimal inflammation.  Likely metabolic encephalopathy.  Supportive care, head CT unremarkable, seen by neuro night of 12/29/2023, no headache or focal deficits.  Continue supportive care, minimize narcotics and benzodiazepines, treat infections as above and monitor.         Condition - Extremely Guarded  Family Communication  : Family friend at bedside on the day of admission 12/29/2023 none present on 12/30/2023  Code Status : Full code  Consults  : Neurology on the night of 12/29/2023  PUD Prophylaxis :    Procedures  :     CT head and C-spine.  Nonacute.  Echocardiogram. 1. Left ventricular ejection fraction, by estimation, is 55 to 60%. The left ventricle has normal function. The left ventricle has no regional wall motion abnormalities. There is severe concentric left ventricular hypertrophy. Left ventricular diastolic  parameters are consistent with Grade I diastolic dysfunction (impaired relaxation).  2. Right ventricular systolic function is normal. The right ventricular size is not well visualized. Tricuspid regurgitation signal is inadequate for assessing PA pressure.  3. The mitral valve is grossly normal. Trivial mitral valve regurgitation.  4. The aortic valve is tricuspid. Aortic valve regurgitation is not visualized. Aortic valve sclerosis is present, with no evidence of aortic valve stenosis.  5. The inferior vena cava is normal in size with greater than 50% respiratory variability, suggesting right atrial pressure of 3 mmHg.      Disposition Plan  :    Status is: Inpatient  DVT Prophylaxis  :    heparin injection 5,000 Units Start: 12/29/23 1545    Lab Results  Component Value Date   PLT 373 12/29/2023    Diet :  Diet Order             DIET SOFT Room service appropriate? Yes; Fluid consistency: Thin  Diet effective now                    Inpatient Medications  Scheduled  Meds:  aspirin EC  81 mg Oral Daily   heparin   5,000 Units Subcutaneous Q8H   midodrine  10 mg Oral TID WC   Continuous Infusions:  cefTRIAXone (ROCEPHIN)  IV     lactated ringers     lactated ringers 100 mL/hr at 12/30/23 0626   PRN Meds:.acetaminophen **OR** acetaminophen, bisacodyl, oxyCODONE, promethazine  Antibiotics  :    Anti-infectives (From admission, onward)    Start     Dose/Rate Route Frequency Ordered Stop   12/30/23 1000  cefTRIAXone (ROCEPHIN) 1 g in sodium chloride 0.9 % 100 mL IVPB        1 g 200 mL/hr over 30 Minutes Intravenous Every 24 hours 12/29/23 1615 01/02/24 0959   12/29/23 1345  ceFEPIme (MAXIPIME) 2 g in sodium chloride 0.9 % 100 mL IVPB        2 g 200 mL/hr over 30 Minutes Intravenous  Once 12/29/23 1340 12/29/23 1503   12/29/23 1345  metroNIDAZOLE (FLAGYL) IVPB 500 mg        500 mg 100 mL/hr over 60 Minutes Intravenous  Once 12/29/23 1340 12/29/23 1606   12/29/23 1345  vancomycin (VANCOCIN) IVPB 1000 mg/200 mL premix        1,000 mg 200 mL/hr over 60 Minutes Intravenous  Once 12/29/23 1340 12/29/23 1716         Objective:   Vitals:   12/30/23 0052 12/30/23 0223 12/30/23 0300 12/30/23 0400  BP: (!) 79/54 (!) 76/57 104/68 102/77  Pulse: 91 80 88 88  Resp: 15 10 14 18   Temp:    98.3 F (36.8 C)  TempSrc:    Oral  SpO2: 91% 94% 95% 91%  Weight:      Height:        Wt Readings from Last 3 Encounters:  12/29/23 121.4 kg     Intake/Output Summary (Last 24 hours) at 12/30/2023 0659 Last data filed at 12/30/2023 0626 Gross per 24 hour  Intake 757.36 ml  Output 400 ml  Net 357.36 ml     Physical Exam  Awake , minimally confused, oriented x 2, No new F.N deficits, Normal affect [place].AT,PERRAL Supple Neck, No JVD,   Symmetrical Chest wall movement, Good air movement bilaterally, CTAB RRR,No Gallops,Rubs or new Murmurs,  +ve B.Sounds, Abd Soft, No tenderness,   No Cyanosis, Clubbing or edema      Data Review:    Recent Labs  Lab 12/29/23 1400 12/29/23 1420 12/29/23 2327   WBC 10.5  --  11.8*  HGB 20.2* 20.4* 17.9*  HCT 57.2* 60.0* 51.4*  PLT 469*  --  373  MCV 86.8  --  88.3  MCH 30.7  --  30.8  MCHC 35.3  --  34.8  RDW 19.4*  --  19.5*  LYMPHSABS 1.6  --  2.5  MONOABS 0.4  --  0.6  EOSABS 0.0  --  0.0  BASOSABS 0.0  --  0.0    Recent Labs  Lab 12/29/23 1400 12/29/23 1420 12/29/23 1548 12/29/23 1604 12/29/23 1715 12/29/23 1726 12/29/23 2327  NA 136 134*  --   --   --   --  135  K 4.7 5.0  --   --   --   --  4.8  CL 90* 104  --   --   --   --  96*  CO2 21*  --   --   --   --   --  20*  ANIONGAP 25*  --   --   --   --   --  19*  GLUCOSE 125* 124*  --   --   --   --  118*  BUN 97* 108*  --   --   --   --  102*  CREATININE 4.13* 4.50* 3.74*  --   --   --  3.94*  AST 123*  --   --   --   --   --  107*  ALT 54*  --   --   --   --   --  50*  ALKPHOS 425*  --   --   --   --   --  347*  BILITOT 3.8*  --   --   --   --   --  3.2*  ALBUMIN <1.5*  --   --   --   --   --  <1.5*  CRP  --   --   --   --   --  0.8 1.1*  PROCALCITON  --   --   --   --   --  3.32 3.85  LATICACIDVEN  --  4.7*  --  4.3*  --   --   --   INR  --   --   --   --  2.0*  --   --   BNP  --   --   --   --   --   --  >4,500.0*  MG  --   --   --   --   --   --  2.4  PHOS  --   --   --   --   --   --  9.1*  CALCIUM 8.8*  --   --   --   --   --  8.0*      Recent Labs  Lab 12/29/23 1400 12/29/23 1420 12/29/23 1604 12/29/23 1715 12/29/23 1726 12/29/23 2327  CRP  --   --   --   --  0.8 1.1*  PROCALCITON  --   --   --   --  3.32 3.85  LATICACIDVEN  --  4.7* 4.3*  --   --   --   INR  --   --   --  2.0*  --   --   BNP  --   --   --   --   --  >4,500.0*  MG  --   --   --   --   --  2.4  CALCIUM 8.8*  --   --   --   --  8.0*    --------------------------------------------------------------------------------------------------------------- No results found for: CHOL, HDL, LDLCALC, LDLDIRECT, TRIG, CHOLHDL  No results found for: HGBA1C No results for input(s):  TSH, T4TOTAL, FREET4, T3FREE, THYROIDAB in the last 72 hours. No results for input(s): VITAMINB12, FOLATE, FERRITIN, TIBC, IRON, RETICCTPCT in the last 72 hours. ------------------------------------------------------------------------------------------------------------------ Cardiac Enzymes No results for input(s): CKMB, TROPONINI, MYOGLOBIN in the last 168 hours.  Invalid input(s): CK  Micro Results Recent Results (from the past 240 hours)  Resp panel by RT-PCR (RSV, Flu A&B, Covid) Anterior Nasal Swab     Status: Abnormal   Collection Time: 12/29/23  2:00 PM   Specimen: Anterior Nasal Swab  Result Value Ref Range Status   SARS Coronavirus 2 by RT PCR POSITIVE (A) NEGATIVE Final   Influenza A by PCR NEGATIVE NEGATIVE Final   Influenza B by PCR NEGATIVE NEGATIVE Final    Comment: (NOTE) The Xpert Xpress SARS-CoV-2/FLU/RSV  plus assay is intended as an aid in the diagnosis of influenza from Nasopharyngeal swab specimens and should not be used as a sole basis for treatment. Nasal washings and aspirates are unacceptable for Xpert Xpress SARS-CoV-2/FLU/RSV testing.  Fact Sheet for Patients: bloggercourse.com  Fact Sheet for Healthcare Providers: seriousbroker.it  This test is not yet approved or cleared by the [place]  FDA and has been authorized for detection and/or diagnosis of SARS-CoV-2 by FDA under an Emergency Use Authorization (EUA). This EUA will remain in effect (meaning this test can be used) for the duration of the COVID-19 declaration under Section 564(b)(1) of the Act, 21 U.S.C. section 360bbb-3(b)(1), unless the authorization is terminated or revoked.     Resp Syncytial Virus by PCR NEGATIVE NEGATIVE Final    Comment: (NOTE) Fact Sheet for Patients: bloggercourse.com  Fact Sheet for Healthcare Providers: seriousbroker.it  This  test is not yet approved or cleared by the [place]  FDA and has been authorized for detection and/or diagnosis of SARS-CoV-2 by FDA under an Emergency Use Authorization (EUA). This EUA will remain in effect (meaning this test can be used) for the duration of the COVID-19 declaration under Section 564(b)(1) of the Act, 21 U.S.C. section 360bbb-3(b)(1), unless the authorization is terminated or revoked.  Performed at [facility], [address] [place], [place] [address]     Radiology Report DG Chest Port 1 View Result Date: 12/30/2023 EXAM: 1 VIEW(S) XRAY OF THE CHEST 12/30/2023 06:35:49 AM COMPARISON: 12/29/2023 CLINICAL HISTORY: SOB (shortness of breath) FINDINGS: LUNGS AND PLEURA: An ill-defined hazy opacity is noted within the left lung base and appears new from the prior exam. No pulmonary edema. No pleural effusion. No pneumothorax. HEART AND MEDIASTINUM: No acute abnormality of the cardiac and mediastinal silhouettes. BONES AND SOFT TISSUES: No acute osseous abnormality. IMPRESSION: 1. New ill-defined hazy opacity at the left lung base, new from prior exam, suspicious for acute airspace disease such as pneumonia or aspiration. Electronically signed by: Waddell Calk MD 12/30/2023 06:44 AM EST RP Workstation: [id number]   CT HEAD WO CONTRAST ( ) Result Date: 12/30/2023 EXAM: CT HEAD WITHOUT CONTRAST 12/30/2023 04:35:01 AM TECHNIQUE: CT of the head was performed without the administration of intravenous contrast. Automated exposure control, iterative reconstruction, and/or weight based adjustment of the mA/kV was utilized to reduce the radiation dose to as low as reasonably achievable. COMPARISON: Head CT dated 12/29/2023. CLINICAL HISTORY: Mental status change, unknown cause. 73 year old female recent fall. Altered mental status. FINDINGS: BRAIN AND VENTRICLES: No acute hemorrhage. No evidence of acute infarct. No hydrocephalus. No extra-axial collection. No mass effect or  midline shift. Chronic lacunar infarct left basal ganglia is stable. Patchy and scattered additional bilateral cerebral white matter hypodensity is stable, mild to moderate for age. Additional heterogeneity in the bilateral deep gray nuclei compatible with chronic small vessel disease appears stable. Stable gray white differentiation. No suspicious intracranial vascular hyperdensity. ORBITS: No acute abnormality. SINUSES: Visible paranasal sinuses, middle ears and mastoids remain well aerated. SOFT TISSUES AND SKULL: No acute soft tissue abnormality. No skull fracture. Calcified atherosclerosis at the skull base. IMPRESSION: 1. No acute intracranial abnormality. 2. Stable  non contrast CT appearance of chronic small vessel disease. Electronically signed by: Helayne Hurst MD 12/30/2023 05:45 AM EST RP Workstation: [id number]   ECHOCARDIOGRAM COMPLETE Result Date: 12/29/2023    ECHOCARDIOGRAM REPORT   Patient Name:   Aamna Mallozzi Date of Exam: 12/29/2023 Medical Rec #:  [id number]  Height:       65.0 in Accession #:    [id number]       Weight:       260.0 lb Date of Birth:  Jun 04, 1950         BSA:          2.211 m Patient Age:    73 years         BP:           124/88 mmHg Patient Gender: F                HR:           89 bpm. Exam Location:  Inpatient Procedure: 2D Echo, Cardiac Doppler and Color Doppler (Both Spectral and Color            Flow Doppler were utilized during procedure). Indications:    Elevated Troponin  History:        Patient has no prior history of Echocardiogram examinations.  Sonographer:    Damien Senior RDCS Referring Phys: MAUDE JAYSON EMMER  Sonographer Comments: Technically difficult study due to patient body habitus. IMPRESSIONS  1. Left ventricular ejection fraction, by estimation, is 55 to 60%. The left ventricle has normal function. The left ventricle has no regional wall motion abnormalities. There is severe concentric left ventricular hypertrophy. Left ventricular diastolic   parameters are consistent with Grade I diastolic dysfunction (impaired relaxation).  2. Right ventricular systolic function is normal. The right ventricular size is not well visualized. Tricuspid regurgitation signal is inadequate for assessing PA pressure.  3. The mitral valve is grossly normal. Trivial mitral valve regurgitation.  4. The aortic valve is tricuspid. Aortic valve regurgitation is not visualized. Aortic valve sclerosis is present, with no evidence of aortic valve stenosis.  5. The inferior vena cava is normal in size with greater than 50% respiratory variability, suggesting right atrial pressure of 3 mmHg. FINDINGS  Left Ventricle: Left ventricular ejection fraction, by estimation, is 55 to 60%. The left ventricle has normal function. The left ventricle has no regional wall motion abnormalities. Definity contrast agent was given IV to delineate the left ventricular  endocardial borders. Strain was performed and the global longitudinal strain is indeterminate. The left ventricular internal cavity size was normal in size. There is severe concentric left ventricular hypertrophy. Left ventricular diastolic parameters are consistent with Grade I diastolic dysfunction (impaired relaxation). Right Ventricle: The right ventricular size is not well visualized. Right vetricular wall thickness was not well visualized. Right ventricular systolic function is normal. Tricuspid regurgitation signal is inadequate for assessing PA pressure. Left Atrium: Left atrial size was normal in size. Right Atrium: Right atrial size was not well visualized. Pericardium: Trivial pericardial effusion is present. Mitral Valve: The mitral valve is grossly normal. Trivial mitral valve regurgitation. Tricuspid Valve: The tricuspid valve is grossly normal. Tricuspid valve regurgitation is not demonstrated. Aortic Valve: The aortic valve is tricuspid. Aortic valve regurgitation is not visualized. Aortic valve sclerosis is present, with  no evidence of aortic valve stenosis. Pulmonic Valve: The pulmonic valve was grossly normal. Pulmonic valve regurgitation is not visualized. Aorta: The aortic root and ascending aorta are structurally normal, with no evidence of dilitation. Venous: The inferior vena cava is normal in size with greater than 50% respiratory variability, suggesting right atrial pressure of 3 mmHg. IAS/Shunts: No atrial level shunt detected by color flow Doppler. Additional Comments: 3D was performed not requiring image post processing on an independent workstation and was indeterminate.  LEFT VENTRICLE PLAX 2D LVIDd:         3.00 cm   Diastology LVIDs:         2.10 cm   LV e' medial:    3.70 cm/s LV PW:         1.90 cm   LV E/e' medial:  16.3 LV IVS:        1.90 cm   LV e' lateral:   3.92 cm/s LVOT diam:     2.20 cm   LV E/e' lateral: 15.4 LV SV:         77 LV SV Index:   35 LVOT Area:     3.80 cm  RIGHT VENTRICLE RV S prime:     11.60 cm/s TAPSE (M-mode): 1.6 cm LEFT ATRIUM             Index LA diam:        3.00 cm 1.36 cm/m LA Vol (A2C):   44.8 ml 20.26 ml/m LA Vol (A4C):   40.5 ml 18.31 ml/m LA Biplane Vol: 43.9 ml 19.85 ml/m  AORTIC VALVE LVOT Vmax:   141.00 cm/s LVOT Vmean:  108.000 cm/s LVOT VTI:    0.203 m  AORTA Ao Root diam: 2.90 cm Ao Asc diam:  3.50 cm MITRAL VALVE MV Area (PHT): 3.07 cm     SHUNTS MV Decel Time: 247 msec     Systemic VTI:  0.20 m MV E velocity: 60.20 cm/s   Systemic Diam: 2.20 cm MV A velocity: 102.00 cm/s MV E/A ratio:  0.59 Maude Emmer MD Electronically signed by Maude Emmer MD Signature Date/Time: 12/29/2023/4:18:35 PM    Final    DG Chest Port 1 View Result Date: 12/29/2023 CLINICAL DATA:  Sepsis. EXAM: PORTABLE CHEST 1 VIEW COMPARISON:  Chest radiograph dated 04/18/2007. FINDINGS: Minimal left lung base atelectasis. No focal consolidation, pleural effusion or pneumothorax. Thickened right perihilar and paramediastinal tissue of indeterminate etiology. Further evaluation with chest CT is  recommended. Stable cardiomegaly. No acute osseous pathology. IMPRESSION: 1. No focal consolidation. 2. Thickened right perihilar and paramediastinal tissue of indeterminate etiology. Further evaluation with chest CT is recommended. Electronically Signed   By: Vanetta Chou M.D.   On: 12/29/2023 16:13   CT Cervical Spine Wo Contrast Result Date: 12/29/2023 CLINICAL DATA:  Clemens out of bed 2 days ago, found down EXAM: CT CERVICAL SPINE WITHOUT CONTRAST TECHNIQUE: Multidetector CT imaging of the cervical spine was performed without intravenous contrast. Multiplanar CT image reconstructions were also generated. RADIATION DOSE REDUCTION: This exam was performed according to the departmental dose-optimization program which includes automated exposure control, adjustment of the mA and/or kV according to patient size and/or use of iterative reconstruction technique. COMPARISON:  None Available. FINDINGS: Alignment: Mild right convex curvature at the cervicothoracic junction. Minimal degenerative anterolisthesis of C7 on T1. Skull base and vertebrae: No acute fracture. No primary bone lesion or focal pathologic process. Soft tissues and spinal canal: No prevertebral fluid or swelling. No visible canal hematoma. Disc levels: Multilevel cervical spondylosis most pronounced at C4-5, C5-6, and C6-7. Lower cervical facet hypertrophic changes greatest at C7-T1. Upper chest: Airway is patent.  Lung apices are clear. Other: Reconstructed images demonstrate no additional findings. IMPRESSION: 1. No acute cervical spine fracture. 2. Multilevel lower cervical spondylosis and facet hypertrophy as above. Electronically Signed   By: Ozell Daring M.D.   On: 12/29/2023 15:04   CT Head Wo Contrast Result Date: 12/29/2023 CLINICAL DATA:  Altered level of consciousness, fell out  of bed, found down EXAM: CT HEAD WITHOUT CONTRAST TECHNIQUE: Contiguous axial images were obtained from the base of the skull through the vertex without  intravenous contrast. RADIATION DOSE REDUCTION: This exam was performed according to the departmental dose-optimization program which includes automated exposure control, adjustment of the mA and/or kV according to patient size and/or use of iterative reconstruction technique. COMPARISON:  None Available. FINDINGS: Brain: Focal hypodensities left basal ganglia with associated ex vacuo dilatation of the left lateral ventricle consistent with chronic lacunar infarcts. Focal hypodensity in the right thalamus as well as mild hypodensity in the left frontal periventricular white matter compatible with age-indeterminate small vessel ischemic changes, likely chronic. No other signs of acute infarct or hemorrhage. Lateral ventricles and midline structures are otherwise unremarkable. No acute extra-axial fluid collections. No mass effect. Vascular: No hyperdense vessel or unexpected calcification. Skull: Normal. Negative for fracture or focal lesion. Sinuses/Orbits: No acute finding. Other: None. IMPRESSION: 1. Chronic appearing small vessel ischemic changes within the left basal ganglia, right thalamus, and left frontal periventricular white matter. 2. No acute intracranial trauma. Electronically Signed   By: Ozell Daring M.D.   On: 12/29/2023 15:02     Signature  -   Lavada Stank M.D on 12/30/2023 at 6:59 AM   -  To page go to [email or web address]

## 2023-12-30 NOTE — Progress Notes (Signed)
 Went in patient room to give her midodrine. She was sleeping heavy. When she woke up, she was not answering my questions and just kept looking off to the side. Called rapid nurse to come to bedside to evaluate. While rapid nurse was in the room, we both noticed patient slurring and having difficulty finding her words. Rapid nurse called neurologist to come assess. Neurologist not concerned about stroke and believes the patient is just confused. Notified Madison Peaches, MD who ordered a CT head per neurologist's recommendation.

## 2023-12-30 NOTE — Evaluation (Signed)
 Physical Therapy Evaluation Patient Details Name: [name] MRN: [id number] DOB: 08/15/1950 Today's Date: 12/30/2023  History of Present Illness  73 y.o. female, adm 11/6 after being found down at home by neighbors. Pt admitted with COVID, possible UTI, dehydration, hypotension, rhabdomyolysis, and AKI. PMH includes: arthritis, poor balance, colon cancer, morbid obesity.   Clinical Impression  Pt presents with condition above and deficits mentioned below, see PT Problem List. PTA, she was mod I using a RW for functional mobility, living alone in a 1-level house with an elevator entry. Currently, the pt displays deficits in balance, processing speed, sequencing, problem-solving, attention, generalized strength, power, and activity tolerance. She at high risk for further falls. At this time, she is needing modA to roll, maxA to transition supine <> sit, and modA to transfer to stand with HHA. She was unable to take steps along the side of the bed despite max cues. Due to her limited support at d/c, her being unsafe to be home alone at this time, and functional decline, she could benefit from short-term inpatient rehab, < 3 hours/day, to maximize her independence and safety with functional mobility. Will continue to follow acutely.        If plan is discharge home, recommend the following: Two people to help with walking and/or transfers;A lot of help with bathing/dressing/bathroom;Assistance with cooking/housework;Direct supervision/assist for medications management;Direct supervision/assist for financial management;Assist for transportation;Supervision due to cognitive status   Can travel by private vehicle   No    Equipment Recommendations BSC/3in1;Wheelchair (measurements PT);Wheelchair cushion (measurements PT);Hospital bed (bariatric for all)  Recommendations for Other Services       Functional Status Assessment Patient has had a recent decline in their functional status and  demonstrates the ability to make significant improvements in function in a reasonable and predictable amount of time.     Precautions / Restrictions Precautions Precautions: Fall Recall of Precautions/Restrictions: Intact Precaution/Restrictions Comments: COVID precautions Restrictions Weight Bearing Restrictions Per Provider Order: No      Mobility  Bed Mobility Overal bed mobility: Needs Assistance Bed Mobility: Rolling, Supine to Sit, Sit to Supine Rolling: Mod assist, Used rails   Supine to sit: Max assist Sit to supine: Max assist   General bed mobility comments: Cued pt to flex legs and reach for contralateral rail to roll bil for pericare and bed linen change. Good initiation rolling to L with modA, limited space rolling to R, needing heavier modA intermittently. Cues provided to bring each leg off R EOB with minA needed there but then maxA to ascend trunk to sit up R EOB with pt holding onto and pulling on therapist to sit up. Pt leaning posteriorly and to the R initially to offload sore perineal area. Pt able to prop to R elbow to descend trunk when cued but needed maxA to assure pt rolled to her back and lifted her legs onto the bed with return to supine.    Transfers Overall transfer level: Needs assistance Equipment used: 1 person hand held assist Transfers: Sit to/from Stand Sit to Stand: Mod assist           General transfer comment: Cued pt to hold onto therapist anterior to her to pull up to stand, modA needed for pt to power up to stand from EOB. Cued pt to step pivot along EOB but pt only stepping in place and unable to advance legs laterally    Ambulation/Gait             Pre-gait activities:  Attempted to cue pt to step along EOB but pt only able to step in place, unsuccessfully advancing either leg laterally/medially despite max cues to weight shift and direct feet with modA for balance and bil HHA support.    Stairs            Wheelchair  Mobility     Tilt Bed    Modified Rankin (Stroke Patients Only) Modified Rankin (Stroke Patients Only) Pre-Morbid Rankin Score: Slight disability Modified Rankin: Severe disability     Balance Overall balance assessment: Needs assistance Sitting-balance support: Feet supported, Bilateral upper extremity supported Sitting balance-Leahy Scale: Poor Sitting balance - Comments: reliant on UE support, initially posterolateral R leaning needing cues to correct, progressing from modA to CGA Postural control: Posterior lean, Right lateral lean Standing balance support: Bilateral upper extremity supported, During functional activity Standing balance-Leahy Scale: Poor Standing balance comment: reliant on bil HHA and modA                             Pertinent Vitals/Pain Pain Assessment Pain Assessment: Faces Faces Pain Scale: Hurts even more Pain Location: perineal area with pericare Pain Descriptors / Indicators: Discomfort, Grimacing, Burning, Sore Pain Intervention(s): Limited activity within patient's tolerance, Monitored during session, Repositioned    Home Living Family/patient expects to be discharged to:: Private residence Living Arrangements: Alone Available Help at Discharge: Family;Available PRN/intermittently Type of Home: House Home Access: Elevator       Home Layout: One level Home Equipment: Agricultural Consultant (2 wheels)      Prior Function Prior Level of Function : Independent/Modified Independent             Mobility Comments: 2 WRW for mobility.  Does not drive. ADLs Comments: Patient stating Mod I for bathing and dressing, continues to cook, light iADL and manages her medications.     Extremity/Trunk Assessment   Upper Extremity Assessment Upper Extremity Assessment: Defer to OT evaluation    Lower Extremity Assessment Lower Extremity Assessment: Generalized weakness (grossly symmetrical noted with functional mobility; denied  numbness/tingling bil; edema noted in bil feet)    Cervical / Trunk Assessment Cervical / Trunk Assessment: Other exceptions Cervical / Trunk Exceptions: increased body habitus  Communication   Communication Communication: No apparent difficulties    Cognition Arousal: Alert Behavior During Therapy: Flat affect   PT - Cognitive impairments: No family/caregiver present to determine baseline                       PT - Cognition Comments: Pt with delayed processing and initiation along with poor sequencing. Unsure if pt is distracted by pain or feeling unwell or if has cognitive deficits. No family present to confirm baseline Following commands: Impaired Following commands impaired: Follows multi-step commands with increased time     Cueing Cueing Techniques: Verbal cues, Tactile cues     General Comments      Exercises     Assessment/Plan    PT Assessment Patient needs continued PT services  PT Problem List Decreased strength;Decreased activity tolerance;Decreased balance;Decreased mobility;Decreased cognition;Obesity       PT Treatment Interventions DME instruction;Gait training;Functional mobility training;Therapeutic activities;Therapeutic exercise;Balance training;Neuromuscular re-education;Cognitive remediation;Patient/family education;Wheelchair mobility training    PT Goals (Current goals can be found in the Care Plan section)  Acute Rehab PT Goals Patient Stated Goal: to get better PT Goal Formulation: With patient Time For Goal Achievement: 01/13/24 Potential to Achieve Goals: Good  Frequency Min 2X/week     Co-evaluation               AM-PAC PT 6 Clicks Mobility  Outcome Measure Help needed turning from your back to your side while in a flat bed without using bedrails?: A Lot Help needed moving from lying on your back to sitting on the side of a flat bed without using bedrails?: A Lot Help needed moving to and from a bed to a chair  (including a wheelchair)?: Total Help needed standing up from a chair using your arms (e.g., wheelchair or bedside chair)?: A Lot Help needed to walk in hospital room?: Total Help needed climbing 3-5 steps with a railing? : Total 6 Click Score: 9    End of Session   Activity Tolerance: Patient tolerated treatment well Patient left: in bed;with call bell/phone within reach;with bed alarm set Nurse Communication: Mobility status;Other (comment);Need for lift equipment (pain at perineal region; stedy) PT Visit Diagnosis: Unsteadiness on feet (R26.81);Other abnormalities of gait and mobility (R26.89);Muscle weakness (generalized) (M62.81);Difficulty in walking, not elsewhere classified (R26.2);History of falling (Z91.81)    Time: 8957-8890 PT Time Calculation (min) (ACUTE ONLY): 27 min   Charges:   PT Evaluation $PT Eval Moderate Complexity: 1 Mod PT Treatments $Therapeutic Activity: 8-22 mins PT General Charges $$ ACUTE PT VISIT: 1 Visit         Theo Ferretti, PT, DPT Acute Rehabilitation Services  Office: [phone]   Theo CHRISTELLA Ferretti 12/30/2023, 12:08 PM

## 2023-12-30 NOTE — TOC Initial Note (Signed)
 Transition of Care [facility]) - Initial/Assessment Note    Patient Details  Name: [name] MRN: [id number] Date of Birth: 21-Jul-1950  Transition of Care [facility]) CM/SW Contact:    Inocente GORMAN Kindle, LCSW Phone Number: 12/30/2023, 4:31 PM  Clinical Narrative:                 Patient admitted from home with COVID. CSW met with patient's sister and nephew outside of the room as RN was providing patient care. They stated they were the best family contacts for patient. Family reported understanding of SNF recommendation and CSW also described home health services. They reported that they will speak with the patient about her wishes.    Skilled Nursing Rehab Facilities-   [email or web address]   Ratings out of 5 stars (5 the highest)  Name Address  Phone # Quality Care Staffing Health Inspection Overall  [facility] [address] [phone] 2 1 2 1   [facility] [address] [place] [phone] 5 2 4 5   [facility] [address], [place] [phone] 2 1 1 1   [facility] [address], [place] [phone] 4 3 4 4   [facility]  [address], [place] [phone] 4 3 5 5   [facility] [address] [facility] [phone] 5 3 2 3   [facility] [address] [place] [phone] 5 1 2 2   [facility] [address] [place] [phone] 3 4 4 4   [address] ([place]) [address] [place] [phone] 1 3 3 2   [facility] [address] [place], [place] [phone] 4 2 2 2   [facility] ([place]) [address], [place] [phone] 2 1 1 1   Clotilda Pereyra [address] [place] [phone] 2 4 4 4   [facility] [address] [place] [phone] 3 1 3 2   [facility] ([facility]) [address], [place] [phone] 1 2 4 3           [facility] [address] [place]  [phone] 3 1 5 4   [facility] [address] [place] [phone] 4 2 1 1   [facility]  [address] [place] [phone] 2 4 1 1   [facility] [place] [address] [phone] 2 2 5 5   [facility] [address] [place] [address], [place] [phone] 2 2 3 3           [facility] [address], [place] [phone] 2 1 2 1   [facility] (No [facility]) [address], [place] [phone] 4 3 4 4   [facility] [address], [facility] [phone] 3  5 5   [facility] [address], [place] [phone] 4 2 1 1   [place] [address] [place] [phone] 3 1 2 1   [facility] [address] [place] [phone] 1 3 3 2   [facility] [address] [place] [phone] 2 2 3 3   [facility] [address] [place], [place] [phone] 2 1 4 3   [facility] [address], [facility] [phone] 2 1 1 1   [facility] [address] [place], [place] [phone] 2 1 2 1   [facility] [address] [place] [phone] 3 1 2 1           [facility] [address] [place] [phone] 4 1 3 2   [facility] [address] [place]  [phone] 2 4 4  4  [facility] (No Humana) [address] [place] [phone] 3 2 5 5   [facility]) [address], [place] [phone] 3 2 3 3   [facility] [address] [place] [phone] 5 3 5 5   [facility] [address], [place] [phone] 2 1 1 1   [facility] [address] [place], [place] [phone] 3 5 5 5           [facility] [address] [place], [place] [phone] 5 4 5 5   [facility])  [address], [place] [phone] 1 1 2 1   [facility]) [address], [place] [phone]  2 4 4   [facility] [address], [place] [phone] 3 5 5 5   [address] [place], [place] [phone] 4 2 2 2   [facility]) [address] [place] [phone] 1 1 3 1   [facility] [address], [place] [phone] 2 2 2 2        Barriers to Discharge: Continued Medical Work up, English As A Second Language Teacher, SNF Covid   Patient Goals and CMS Choice Patient states their goals for this hospitalization and ongoing recovery are:: Rehab CMS Medicare.gov Compare Post Acute Care list provided to:: Patient Represenative (must comment) Choice offered to / list presented to : Sibling      Expected Discharge Plan and Services In-house Referral: Clinical Social Work     Living arrangements for the past 2 months: Single Family Home                                      Prior Living Arrangements/Services Living arrangements for the past 2 months: Single Family Home Lives with:: Self Patient language and need for interpreter reviewed:: Yes Do you feel safe going back to the place where you live?: Yes      Need for Family Participation in Patient Care: Yes (Comment) Care giver support system in place?: Yes (comment)   Criminal Activity/Legal Involvement Pertinent to Current Situation/Hospitalization: No - Comment as needed  Activities of Daily Living   ADL Screening (condition at time of admission) Independently performs ADLs?: Yes (appropriate for developmental age) Is the patient deaf or have difficulty hearing?: No Does the patient have difficulty seeing, even when wearing glasses/contacts?: No Does the patient have difficulty concentrating, remembering, or making decisions?: No  Permission Sought/Granted Permission sought to share information with : Facility Medical Sales Representative, Family Supports Permission granted to share information with : Yes, Verbal Permission Granted  Share Information with NAME: Rzepka,Russell -Brother [phone] or Sister           Emotional Assessment Appearance::  Appears stated age     Orientation: : Oriented to Self, Oriented to Place, Oriented to  Time, Oriented to Situation Alcohol / Substance Use: Not Applicable Psych Involvement: No (comment)  Admission diagnosis:  AKI (acute kidney injury) [N17.9] Acute kidney injury [N17.9] Patient Active Problem List   Diagnosis Date Noted   AKI (acute kidney injury) 12/29/2023   Elevated troponin 12/29/2023   PCP:  Pcp, No Pharmacy:   [id number] [address], [place] - [address] [place] [address] Phone: [phone] Fax: [phone]     Social Drivers of Health (SDOH) Social History: SDOH Screenings   Food Insecurity: No Food Insecurity (12/29/2023)  Housing: Low Risk  (12/29/2023)  Transportation Needs: No Transportation Needs (12/29/2023)  Utilities: Not At Risk (12/29/2023)  Social Connections: Unknown (12/29/2023)  Tobacco Use: Medium Risk (12/29/2023)   SDOH Interventions:     Readmission Risk Interventions     No data to display

## 2023-12-30 NOTE — Evaluation (Signed)
 Occupational Therapy Evaluation Patient Details Name: [name] MRN: [id number] DOB: [birth year] Today's Date: 12/30/2023   History of Present Illness   73 y.o. female, adm 11/6 after being found down at home by neighbors.  PMH includes: arthritis, poor balance, colon cancer, morbid obesity.     Clinical Impressions Patient admitted for diagnosis above.  PTA she lives alone, and per her report is currently performing significantly under her baseline.  +2 for simple rolling.  Patient needing Min A for bedlevel grooming.  OT will follow in the acute setting to address deficits, Patient will benefit from continued inpatient follow up therapy, <3 hours/day.     If plan is discharge home, recommend the following:   Assistance with cooking/housework;Two people to help with walking and/or transfers;A lot of help with bathing/dressing/bathroom     Functional Status Assessment   Patient has had a recent decline in their functional status and demonstrates the ability to make significant improvements in function in a reasonable and predictable amount of time.     Equipment Recommendations   BSC/3in1;Tub/shower bench     Recommendations for Other Services         Precautions/Restrictions   Precautions Precautions: Fall Recall of Precautions/Restrictions: Intact Restrictions Weight Bearing Restrictions Per Provider Order: No     Mobility Bed Mobility Overal bed mobility: Needs Assistance Bed Mobility: Rolling Rolling: Max assist, Total assist              Transfers                          Balance Overall balance assessment: Needs assistance                                         ADL either performed or assessed with clinical judgement   ADL Overall ADL's : Needs assistance/impaired Eating/Feeding: Minimal assistance;Bed level   Grooming: Wash/dry face;Minimal assistance;Bed level   Upper Body Bathing: Moderate  assistance;Bed level   Lower Body Bathing: Maximal assistance;Total assistance;Bed level   Upper Body Dressing : Moderate assistance;Bed level   Lower Body Dressing: Maximal assistance;Total assistance;Bed level       Toileting- Clothing Manipulation and Hygiene: Total assistance;Bed level;+2 for physical assistance               Vision Patient Visual Report: No change from baseline       Perception Perception: Not tested       Praxis Praxis: Not tested       Pertinent Vitals/Pain Pain Assessment Pain Assessment: Faces Faces Pain Scale: Hurts a little bit Pain Location: Generalized with movement Pain Descriptors / Indicators: Aching Pain Intervention(s): Monitored during session     Extremity/Trunk Assessment Upper Extremity Assessment Upper Extremity Assessment: Generalized weakness;Right hand dominant   Lower Extremity Assessment Lower Extremity Assessment: Defer to PT evaluation   Cervical / Trunk Assessment Cervical / Trunk Assessment: Other exceptions Cervical / Trunk Exceptions: Body habitus   Communication Communication Communication: No apparent difficulties   Cognition Arousal: Lethargic Behavior During Therapy: WFL for tasks assessed/performed Cognition: No apparent impairments             OT - Cognition Comments: little slow to respond                 Following commands: Intact       Cueing  General Comments  Cueing Techniques: Verbal cues      Exercises     Shoulder Instructions      Home Living Family/patient expects to be discharged to:: Private residence Living Arrangements: Alone Available Help at Discharge: Family;Available PRN/intermittently Type of Home: House Home Access: Elevator     Home Layout: One level     Bathroom Shower/Tub: Chief Strategy Officer: Handicapped height Bathroom Accessibility: Yes How Accessible: Accessible via walker Home Equipment: Rolling Walker (2 wheels)           Prior Functioning/Environment Prior Level of Function : Independent/Modified Independent             Mobility Comments: 2 WRW fo mobility.  Does not drive. ADLs Comments: Patient stating Mod I for bathing and dressing, continues to cook, light iADL and manages her medications.    OT Problem List: Decreased strength;Decreased range of motion;Decreased activity tolerance;Impaired balance (sitting and/or standing);Pain   OT Treatment/Interventions: Self-care/ADL training;Therapeutic activities;Therapeutic exercise;Patient/family education;Balance training;DME and/or AE instruction      OT Goals(Current goals can be found in the care plan section)   Acute Rehab OT Goals Patient Stated Goal: Return home OT Goal Formulation: With patient Time For Goal Achievement: 01/13/24 Potential to Achieve Goals: Fair ADL Goals Pt Will Perform Grooming: with set-up;sitting Pt Will Perform Upper Body Bathing: with min assist;sitting Pt Will Perform Upper Body Dressing: with min assist;sitting Pt Will Transfer to Toilet: with min assist;with +2 assist;stand pivot transfer;bedside commode   OT Frequency:  Min 2X/week    Co-evaluation              AM-PAC OT 6 Clicks Daily Activity     Outcome Measure Help from another person eating meals?: A Little Help from another person taking care of personal grooming?: A Little Help from another person toileting, which includes using toliet, bedpan, or urinal?: A Lot Help from another person bathing (including washing, rinsing, drying)?: A Lot Help from another person to put on and taking off regular upper body clothing?: A Lot Help from another person to put on and taking off regular lower body clothing?: A Lot 6 Click Score: 14   End of Session Nurse Communication: Other (comment) (Patient soiled and needing +2 for assist.  OT waited 15 min for assist, no one came.  RN and NT notified patient needing to be cleaned.)  Activity  Tolerance: Patient tolerated treatment well Patient left: in bed;with call bell/phone within reach  OT Visit Diagnosis: Muscle weakness (generalized) (M62.81)                Time: 9184-9159 OT Time Calculation (min): 25 min Charges:  OT General Charges $OT Visit: 1 Visit OT Evaluation $OT Eval Moderate Complexity: 1 Mod OT Treatments $Self Care/Home Management : 8-22 mins  12/30/2023  RP, OTR/L  Acute Rehabilitation Services  Office:  [phone]   Charlie JONETTA Halsted 12/30/2023, 8:51 AM

## 2023-12-30 NOTE — Plan of Care (Signed)

## 2023-12-30 NOTE — Evaluation (Signed)
 Clinical/Bedside Swallow Evaluation Patient Details  Name: [name] MRN: [id number] Date of Birth: August 15, 1950  Today's Date: 12/30/2023 Time: SLP Start Time (ACUTE ONLY): 0846 SLP Stop Time (ACUTE ONLY): 0855 SLP Time Calculation (min) (ACUTE ONLY): 9 min  Past Medical History:  Past Medical History:  Diagnosis Date   Asthma    colon ca dx'd 03/2007   surg only   Hypertension    Past Surgical History:  Past Surgical History:  Procedure Laterality Date   COLECTOMY     TUBAL LIGATION     HPI:  73 y.o. female who fell out of the bed.  CT was nonacute  work up suggestive of severe dehydration, hypotension, rhabdomyolysis, AKI, elevated troponin. MD stated pt currently sympton free except for fatigue. Found to have COVID-19, possible UTI, mild delirium. CXR  New ill-defined hazy opacity at the left lung base, new from prior exam, suspicious for acute airspace disease such as pneumonia or aspiration.    Assessment / Plan / Recommendation  Clinical Impression  Pt demonstrates functional swallow ability from clinical assessment. Respirations stable prior and throughout with no changes. She denied difficulty swallowing. Orally she manipulated and appeared to transit without difficulty including timely mastication without residue. No concerns of decreased airway protection across all trials. Aspiration risk appears low and suspect suspicion for acute airspace disease on CXR  likely due to current Covid infection. Recommend regular texture, thin liquids, pills with thin and no further ST needed at this time. SLP Visit Diagnosis: Dysphagia, unspecified (R13.10)    Aspiration Risk  No limitations    Diet Recommendation Regular;Thin liquid    Liquid Administration via: Straw;Cup Medication Administration: Whole meds with liquid Supervision: Staff to assist with self feeding Postural Changes: Seated upright at 90 degrees    Other  Recommendations Oral Care Recommendations: Oral care  BID     Assistance Recommended at Discharge    Functional Status Assessment Patient has not had a recent decline in their functional status  Frequency and Duration            Prognosis        Swallow Study   General Date of Onset: 12/30/23 HPI: 73 y.o. female who fell out of the bed.  CT was nonacute  work up suggestive of severe dehydration, hypotension, rhabdomyolysis, AKI, elevated troponin. MD stated pt currently sympton free except for fatigue. Found to have COVID-19, possible UTI, mild delirium. CXR  New ill-defined hazy opacity at the left lung base, new from prior exam, suspicious for acute airspace disease such as pneumonia or aspiration. Type of Study: Bedside Swallow Evaluation Previous Swallow Assessment:  (none) Diet Prior to this Study: Thin liquids (Level 0);Other (Comment) (soft) Temperature Spikes Noted: No Respiratory Status: Room air History of Recent Intubation: No Behavior/Cognition: Alert;Cooperative;Pleasant mood Oral Cavity Assessment: Within Functional Limits Oral Care Completed by SLP: No Oral Cavity - Dentition:  (has majority- missing some posterior) Vision: Functional for self-feeding Self-Feeding Abilities: Needs assist (d/t hand weakness) Patient Positioning: Upright in bed Baseline Vocal Quality: Normal Volitional Cough: Weak Volitional Swallow: Able to elicit    Oral/Motor/Sensory Function Overall Oral Motor/Sensory Function: Within functional limits   Ice Chips Ice chips: Not tested   Thin Liquid Thin Liquid: Within functional limits Presentation: Cup;Straw    Nectar Thick Nectar Thick Liquid: Not tested   Honey Thick Honey Thick Liquid: Not tested   Puree     Solid     Solid: Within functional limits  Dustin Olam Bull 12/30/2023,9:13 AM

## 2023-12-30 NOTE — Progress Notes (Addendum)
 Team requested brief evaluation for concern for Neuro change.  Briefly, Ms. [name] is a 73 y.o. female who is admitted after she fell while trying to get out of bed and was unable to get up despite multiple attempts. She is admitted with AKI, hypotension, deconditioning, rhabdo, hemoconcentration.  Labs are improving. She is hypotensive and has required small fluids boluses along with midrodrine. RN woke her up from sleep tonight and noticed that she would look to left but not to right. Rapid response called and I was close by and was able to evaluate Ms. [name].  On my evaluation, she is confused and disoriented to her age but later corrected herself. She has poor attention and wants me to slow down while talking to her.  NIHSS components Score: Comment  1a Level of Conscious 0[x]  1[]  2[]  3[]      1b LOC Questions 0[]  1[x]  2[]       1c LOC Commands 0[x]  1[]  2[]       2 Best Gaze 0[x]  1[]  2[]       3 Visual 0[x]  1[]  2[]  3[]      4 Facial Palsy 0[x]  1[]  2[]  3[]      5a Motor Arm - left 0[x]  1[]  2[]  3[]  4[]  UN[]    5b Motor Arm - Right 0[x]  1[]  2[]  3[]  4[]  UN[]    6a Motor Leg - Left 0[]  1[]  2[x]  3[]  4[]  UN[]  BL hip flexion weakness, I suspect is secondary to deconditioning given intact knee extension and dorsi and plantar flexion BL.  6b Motor Leg - Right 0[]  1[]  2[x]  3[]  4[]  UN[]    7 Limb Ataxia 0[x]  1[]  2[]  3[]  UN[]     8 Sensory 0[x]  1[]  2[]  UN[]      9 Best Language 0[x]  1[]  2[]  3[]      10 Dysarthria 0[x]  1[]  2[]  UN[]      11 Extinct. and Inattention 0[x]  1[]  2[]       TOTAL: 4    Overall, exam is non focal and low suspicion for stroke. I also reviewed her prior CT Head from yesterday which shows chronic strokes in the left basal ganglia, right thalamus, and left frontal periventricular white matter but no acute findings.  Her poor attention, confusion and bradyphernia can be explained by a combination of hypotension, exhaustion frome being on the floor for 2 days, AKI, rhabdo,  deconditioning and Covid positive.  Will get a CT Head w/o contrast to rule out acute abnormalities but I would let her rest otherwise at this time. She is not a candidate for tnkase as she does not have focal deficit, low suspicion for stroke mild to treat. She is not a candidate for thrombectomy due to low suspicion for stroke or LVO.  Neurology will signoff. Plan discussed with Dr. Lawence with the overnight Hospitalist team and with rapid response RN.  Aranda Bihm [facility]

## 2023-12-30 NOTE — Consult Note (Signed)
 NAME:  [name], MRN:  [id number], DOB:  08/24/1950, LOS: 1 ADMISSION DATE:  12/29/2023, CONSULTATION DATE:  12/30/23 REFERRING MD:  Dennise, CHIEF COMPLAINT:  weakness   History of Present Illness:  Patient presenting with B symptoms, poor PO found to be hypotensive and have AKI.  Workup reveals COVID +.  Still hypotensive, orthostatic on floor so PCCM consulted.  Currently patient does feel better with IVF but still some dizziness on standing and overall persistent weakness.  BNP way up but echo read benign, poor windows noted. Additional fluids and repeat lactate ordered.  Pertinent  Medical History   Past Medical History:  Diagnosis Date   Asthma    colon ca dx'd 03/2007   surg only   Hypertension      Significant Hospital Events: Including procedures, antibiotic start and stop dates in addition to other pertinent events     Interim History / Subjective:  consult  Objective    Blood pressure (!) 76/56, pulse 69, temperature 98.3 F (36.8 C), temperature source Oral, resp. rate 11, height 5' 5 (1.651 m), weight 121.4 kg, SpO2 92%.        Intake/Output Summary (Last 24 hours) at 12/30/2023 1434 Last data filed at 12/30/2023 9373 Gross per 24 hour  Intake 757.36 ml  Output 400 ml  Net 357.36 ml   Filed Weights   12/29/23 1816 12/29/23 2054  Weight: 117.9 kg 121.4 kg    Examination: General: obese in NAD HENT: MMM, trachea midline Lungs: clear, no wheezing Cardiovascular: regular, sinus 70s monitor, ext warm Abdomen: soft, +BS Extremities: no edema Neuro: answers questions and moves ext x 4 GU: anuric at present  Labs and imaging reviewed  Resolved problem list   Assessment and Plan   Mixed septic and hypovolemic shock- in context of COVID infection, possible LLL CAP: responsive to fluids so far; no signs of worsening malperfusion but awaiting lactate. Pre-renal and septic ATN- presumed, renal US  pending, CK modestly up, anuric per nursing and  trouble finding anything on bladder scan Class 3 obesity COVID + Hx arthritis  - Agree with lactate, if up and patient BP continue to drift down likely needs ICU for pressors and additional workup - Does not appear to be in low flow state although BNP noted; marked LVH and underfilled LV on personal review of syngo images - Agree with abx - Place foley, renal US  - Do not see role for steroids at present - Agree with trial of midodrine - Not a straightforward picture, hopefully improves with additional fluids  Labs   CBC: Recent Labs  Lab 12/29/23 1400 12/29/23 1420 12/29/23 2327  WBC 10.5  --  11.8*  NEUTROABS 8.4*  --  8.6*  HGB 20.2* 20.4* 17.9*  HCT 57.2* 60.0* 51.4*  MCV 86.8  --  88.3  PLT 469*  --  373    Basic Metabolic Panel: Recent Labs  Lab 12/29/23 1400 12/29/23 1420 12/29/23 1548 12/29/23 2327  NA 136 134*  --  135  K 4.7 5.0  --  4.8  CL 90* 104  --  96*  CO2 21*  --   --  20*  GLUCOSE 125* 124*  --  118*  BUN 97* 108*  --  102*  CREATININE 4.13* 4.50* 3.74* 3.94*  CALCIUM 8.8*  --   --  8.0*  MG  --   --   --  2.4  PHOS  --   --   --  9.1*  GFR: Estimated Creatinine Clearance: 16.6 mL/min (A) (by C-G formula based on SCr of 3.94 mg/dL (H)). Recent Labs  Lab 12/29/23 1400 12/29/23 1420 12/29/23 1604 12/29/23 1726 12/29/23 2327  PROCALCITON  --   --   --  3.32 3.85  WBC 10.5  --   --   --  11.8*  LATICACIDVEN  --  4.7* 4.3*  --   --     Liver Function Tests: Recent Labs  Lab 12/29/23 1400 12/29/23 2327  AST 123* 107*  ALT 54* 50*  ALKPHOS 425* 347*  BILITOT 3.8* 3.2*  PROT 5.0* 4.2*  ALBUMIN <1.5* <1.5*   No results for input(s): LIPASE, AMYLASE in the last 168 hours. No results for input(s): AMMONIA in the last 168 hours.  ABG    Component Value Date/Time   TCO2 27 12/29/2023 1420     Coagulation Profile: Recent Labs  Lab 12/29/23 1715  INR 2.0*    Cardiac Enzymes: Recent Labs  Lab 12/29/23 1400  12/29/23 2327  CKTOTAL 1,496* 813*    HbA1C: No results found for: HGBA1C  CBG: Recent Labs  Lab 12/30/23 0308  GLUCAP 107*    Review of Systems:    Positive Symptoms in bold:  Constitutional fevers, chills, weight loss, fatigue, anorexia, malaise  Eyes decreased vision, double vision, eye irritation  Ears, Nose, Mouth, Throat sore throat, trouble swallowing, sinus congestion  Cardiovascular chest pain, paroxysmal nocturnal dyspnea, lower ext edema, palpitations   Respiratory SOB, cough, DOE, hemoptysis, wheezing  Gastrointestinal nausea, vomiting, diarrhea  Genitourinary burning with urination, trouble urinating  Musculoskeletal joint aches, joint swelling, back pain  Integumentary  rashes, skin lesions  Neurological focal weakness, focal numbness, trouble speaking, headaches  Psychiatric depression, anxiety, confusion  Endocrine polyuria, polydipsia, cold intolerance, heat intolerance  Hematologic abnormal bruising, abnormal bleeding, unexplained nose bleeds  Allergic/Immunologic recurrent infections, hives, swollen lymph nodes     Past Medical History:  She,  has a past medical history of Asthma, colon ca (dx'd 03/2007), and Hypertension.   Surgical History:   Past Surgical History:  Procedure Laterality Date   COLECTOMY     TUBAL LIGATION       Social History:   reports that she has quit smoking. Her smoking use included cigarettes. She has never been exposed to tobacco smoke. She has never used smokeless tobacco. She reports that she does not drink alcohol and does not use drugs.   Family History:  Her family history is not on file.   Allergies No Known Allergies   Home Medications  Prior to Admission medications   Medication Sig Start Date End Date Taking? Authorizing Provider  Cholecalciferol (VITAMIN D3 PO) Take 2 each by mouth daily. Gummies   Yes [provider]  Cyanocobalamin (VITAMIN B12 PO) Take 1 tablet by mouth daily.   Yes  [provider]  Multiple Vitamins-Minerals (WOMENS MULTIVITAMIN PO) Take 1 tablet by mouth daily.   Yes [provider]     Critical care time: N/A

## 2023-12-30 NOTE — Progress Notes (Signed)
 PHARMACY - PHYSICIAN COMMUNICATION CRITICAL VALUE ALERT - BLOOD CULTURE IDENTIFICATION (BCID)  [name] is an 73 y.o. female who presented to [facility] on 12/29/2023 with a chief complaint of a fall.   Assessment: 73 year old female admitted with a fall. Currently being treated for possible UTI.  Blood cultures with 1/4 MRSE- likely contaminant.   Name of physician (or Provider) Contacted: Dennise   Current antibiotics: ceftriaxone/ metronidazole  Changes to prescribed antibiotics:  None based on this culture  Results for orders placed or performed during the hospital encounter of 12/29/23  Blood Culture ID Panel (Reflexed) (Collected: 12/29/2023  1:45 PM)  Result Value Ref Range   Enterococcus faecalis NOT DETECTED NOT DETECTED   Enterococcus Faecium NOT DETECTED NOT DETECTED   Listeria monocytogenes NOT DETECTED NOT DETECTED   Staphylococcus species DETECTED (A) NOT DETECTED   Staphylococcus aureus (BCID) NOT DETECTED NOT DETECTED   Staphylococcus epidermidis DETECTED (A) NOT DETECTED   Staphylococcus lugdunensis NOT DETECTED NOT DETECTED   Streptococcus species NOT DETECTED NOT DETECTED   Streptococcus agalactiae NOT DETECTED NOT DETECTED   Streptococcus pneumoniae NOT DETECTED NOT DETECTED   Streptococcus pyogenes NOT DETECTED NOT DETECTED   A.calcoaceticus-baumannii NOT DETECTED NOT DETECTED   Bacteroides fragilis NOT DETECTED NOT DETECTED   Enterobacterales NOT DETECTED NOT DETECTED   Enterobacter cloacae complex NOT DETECTED NOT DETECTED   Escherichia coli NOT DETECTED NOT DETECTED   Klebsiella aerogenes NOT DETECTED NOT DETECTED   Klebsiella oxytoca NOT DETECTED NOT DETECTED   Klebsiella pneumoniae NOT DETECTED NOT DETECTED   Proteus species NOT DETECTED NOT DETECTED   Salmonella species NOT DETECTED NOT DETECTED   Serratia marcescens NOT DETECTED NOT DETECTED   Haemophilus influenzae NOT DETECTED NOT DETECTED   Neisseria meningitidis NOT DETECTED NOT DETECTED    Pseudomonas aeruginosa NOT DETECTED NOT DETECTED   Stenotrophomonas maltophilia NOT DETECTED NOT DETECTED   Candida albicans NOT DETECTED NOT DETECTED   Candida auris NOT DETECTED NOT DETECTED   Candida glabrata NOT DETECTED NOT DETECTED   Candida krusei NOT DETECTED NOT DETECTED   Candida parapsilosis NOT DETECTED NOT DETECTED   Candida tropicalis NOT DETECTED NOT DETECTED   Cryptococcus neoformans/gattii NOT DETECTED NOT DETECTED   Methicillin resistance mecA/C DETECTED (A) NOT DETECTED    Damien Quiet, PharmD, BCPS, BCIDP Infectious Diseases Clinical Pharmacist Phone: [phone] 12/30/2023  11:17 AM

## 2023-12-30 NOTE — Progress Notes (Signed)
 12/30/2023 Patient consents to albumin administration.  Rolan Sharps MD PCCM

## 2023-12-31 ENCOUNTER — Encounter (HOSPITAL_COMMUNITY): Payer: Self-pay | Admitting: Internal Medicine

## 2023-12-31 ENCOUNTER — Inpatient Hospital Stay (HOSPITAL_COMMUNITY)

## 2023-12-31 DIAGNOSIS — M7989 Other specified soft tissue disorders: Secondary | ICD-10-CM | POA: Diagnosis not present

## 2023-12-31 DIAGNOSIS — N179 Acute kidney failure, unspecified: Secondary | ICD-10-CM | POA: Diagnosis not present

## 2023-12-31 DIAGNOSIS — J189 Pneumonia, unspecified organism: Secondary | ICD-10-CM

## 2023-12-31 LAB — COMPREHENSIVE METABOLIC PANEL WITH GFR
ALT: 35 U/L (ref 0–44)
ALT: 47 U/L — ABNORMAL HIGH (ref 0–44)
AST: 71 U/L — ABNORMAL HIGH (ref 15–41)
AST: 98 U/L — ABNORMAL HIGH (ref 15–41)
Albumin: 2 g/dL — ABNORMAL LOW (ref 3.5–5.0)
Albumin: 2.8 g/dL — ABNORMAL LOW (ref 3.5–5.0)
Alkaline Phosphatase: 223 U/L — ABNORMAL HIGH (ref 38–126)
Alkaline Phosphatase: 343 U/L — ABNORMAL HIGH (ref 38–126)
Anion gap: 17 — ABNORMAL HIGH (ref 5–15)
Anion gap: 18 — ABNORMAL HIGH (ref 5–15)
BUN: 104 mg/dL — ABNORMAL HIGH (ref 8–23)
BUN: 102 mg/dL — ABNORMAL HIGH (ref 8–23)
CO2: 21 mmol/L — ABNORMAL LOW (ref 22–32)
CO2: 20 mmol/L — ABNORMAL LOW (ref 22–32)
Calcium: 8.1 mg/dL — ABNORMAL LOW (ref 8.9–10.3)
Calcium: 8.6 mg/dL — ABNORMAL LOW (ref 8.9–10.3)
Chloride: 97 mmol/L — ABNORMAL LOW (ref 98–111)
Chloride: 93 mmol/L — ABNORMAL LOW (ref 98–111)
Creatinine, Ser: 4.28 mg/dL — ABNORMAL HIGH (ref 0.44–1.00)
Creatinine, Ser: 4.49 mg/dL — ABNORMAL HIGH (ref 0.44–1.00)
GFR, Estimated: 10 mL/min — ABNORMAL LOW (ref >60)
GFR, Estimated: 10 mL/min — ABNORMAL LOW (ref >60)
Glucose, Bld: 82 mg/dL (ref 70–99)
Glucose, Bld: 110 mg/dL — ABNORMAL HIGH (ref 70–99)
Potassium: 4 mmol/L (ref 3.5–5.1)
Potassium: 4.6 mmol/L (ref 3.5–5.1)
Sodium: 135 mmol/L (ref 135–145)
Sodium: 131 mmol/L — ABNORMAL LOW (ref 135–145)
Total Bilirubin: 3.6 mg/dL — ABNORMAL HIGH (ref 0.0–1.2)
Total Bilirubin: 4.7 mg/dL — ABNORMAL HIGH (ref 0.0–1.2)
Total Protein: 3.8 g/dL — ABNORMAL LOW (ref 6.5–8.1)
Total Protein: 4.9 g/dL — ABNORMAL LOW (ref 6.5–8.1)

## 2023-12-31 LAB — PROCALCITONIN: Procalcitonin: 3.27 ng/mL

## 2023-12-31 LAB — CBC WITH DIFFERENTIAL/PLATELET
Abs Immature Granulocytes: 0.05 K/uL (ref 0.00–0.07)
Basophils Absolute: 0 K/uL (ref 0.0–0.1)
Basophils Relative: 0 %
Eosinophils Absolute: 0.2 K/uL (ref 0.0–0.5)
Eosinophils Relative: 2 %
HCT: 32.5 % — ABNORMAL LOW (ref 36.0–46.0)
Hemoglobin: 11.8 g/dL — ABNORMAL LOW (ref 12.0–15.0)
Immature Granulocytes: 1 %
Lymphocytes Relative: 31 %
Lymphs Abs: 3 K/uL (ref 0.7–4.0)
MCH: 31 pg (ref 26.0–34.0)
MCHC: 36.3 g/dL — ABNORMAL HIGH (ref 30.0–36.0)
MCV: 85.3 fL (ref 80.0–100.0)
Monocytes Absolute: 0.7 K/uL (ref 0.1–1.0)
Monocytes Relative: 8 %
Neutro Abs: 5.7 K/uL (ref 1.7–7.7)
Neutrophils Relative %: 58 %
Platelets: 275 K/uL (ref 150–400)
RBC: 3.81 MIL/uL — ABNORMAL LOW (ref 3.87–5.11)
RDW: 17.8 % — ABNORMAL HIGH (ref 11.5–15.5)
WBC: 9.8 K/uL (ref 4.0–10.5)
nRBC: 0 % (ref 0.0–0.2)

## 2023-12-31 LAB — LACTIC ACID, PLASMA
Lactic Acid, Venous: 1.9 mmol/L (ref 0.5–1.9)
Lactic Acid, Venous: 2.6 mmol/L (ref 0.5–1.9)

## 2023-12-31 LAB — CK: Total CK: 380 U/L — ABNORMAL HIGH (ref 38–234)

## 2023-12-31 LAB — CBC
HCT: 38.8 % (ref 36.0–46.0)
Hemoglobin: 13.3 g/dL (ref 12.0–15.0)
MCH: 30.5 pg (ref 26.0–34.0)
MCHC: 34.3 g/dL (ref 30.0–36.0)
MCV: 89 fL (ref 80.0–100.0)
Platelets: 286 K/uL (ref 150–400)
RBC: 4.36 MIL/uL (ref 3.87–5.11)
RDW: 18.9 % — ABNORMAL HIGH (ref 11.5–15.5)
WBC: 8.9 K/uL (ref 4.0–10.5)
nRBC: 0 % (ref 0.0–0.2)

## 2023-12-31 LAB — T4, FREE: Free T4: 1.28 ng/dL — ABNORMAL HIGH (ref 0.61–1.12)

## 2023-12-31 LAB — CULTURE, BLOOD (ROUTINE X 2): Special Requests: ADEQUATE

## 2023-12-31 LAB — HEMOGLOBIN A1C
Hgb A1c MFr Bld: 5.8 % — ABNORMAL HIGH (ref 4.8–5.6)
Mean Plasma Glucose: 119.76 mg/dL

## 2023-12-31 LAB — C-REACTIVE PROTEIN: CRP: 1.1 mg/dL — ABNORMAL HIGH (ref <1.0)

## 2023-12-31 LAB — MRSA NEXT GEN BY PCR, NASAL: MRSA by PCR Next Gen: NOT DETECTED

## 2023-12-31 LAB — PHOSPHORUS
Phosphorus: 6.9 mg/dL — ABNORMAL HIGH (ref 2.5–4.6)
Phosphorus: 5.6 mg/dL — ABNORMAL HIGH (ref 2.5–4.6)

## 2023-12-31 LAB — MAGNESIUM
Magnesium: 2.1 mg/dL (ref 1.7–2.4)
Magnesium: 2.5 mg/dL — ABNORMAL HIGH (ref 1.7–2.4)

## 2023-12-31 LAB — TSH: TSH: 3.934 u[IU]/mL (ref 0.350–4.500)

## 2023-12-31 MED ORDER — ORAL CARE MOUTH RINSE: 15.0000 mL | OROMUCOSAL | Status: DC | PRN

## 2023-12-31 MED ORDER — METHYLPREDNISOLONE SODIUM SUCC 125 MG IJ SOLR
60.0000 mg | Freq: Every day | INTRAMUSCULAR | Status: DC
  Administered 2023-12-31: 60 mg via INTRAVENOUS
  Filled 2023-12-31: qty 2

## 2023-12-31 MED ORDER — GLYCOPYRROLATE 0.2 MG/ML IJ SOLN: 0.2000 mg | INTRAMUSCULAR | Status: DC | PRN

## 2023-12-31 MED ORDER — FENTANYL CITRATE (PF) 50 MCG/ML IJ SOSY
25.0000 ug | PREFILLED_SYRINGE | INTRAMUSCULAR | Status: DC | PRN
  Administered 2023-12-31 – 2024-01-01 (×3): 50 ug via INTRAVENOUS
  Filled 2023-12-31 (×3): qty 1

## 2023-12-31 MED ORDER — ONDANSETRON HCL 4 MG/2ML IJ SOLN: 4.0000 mg | Freq: Four times a day (QID) | INTRAMUSCULAR | Status: DC | PRN

## 2023-12-31 MED ORDER — MIDAZOLAM HCL (PF) 2 MG/2ML IJ SOLN
2.0000 mg | INTRAMUSCULAR | Status: DC | PRN
  Administered 2024-01-01: 2 mg via INTRAVENOUS
  Filled 2023-12-31: qty 2

## 2023-12-31 MED ORDER — HEPARIN (PORCINE) 25000 UT/250ML-% IV SOLN
1500.0000 [IU]/h | INTRAVENOUS | Status: DC
  Administered 2023-12-31: 1500 [IU]/h via INTRAVENOUS
  Filled 2023-12-31: qty 250

## 2023-12-31 MED ORDER — SODIUM CHLORIDE 0.9 % IV SOLN: INTRAVENOUS | Status: DC

## 2023-12-31 MED ORDER — ACETAMINOPHEN 325 MG PO TABS: 650.0000 mg | ORAL_TABLET | Freq: Four times a day (QID) | ORAL | Status: DC | PRN

## 2023-12-31 MED ORDER — HEPARIN BOLUS VIA INFUSION
4000.0000 [IU] | Freq: Once | INTRAVENOUS | Status: AC
  Administered 2023-12-31: 4000 [IU] via INTRAVENOUS
  Filled 2023-12-31: qty 4000

## 2023-12-31 MED ORDER — ACETAMINOPHEN 650 MG RE SUPP: 650.0000 mg | Freq: Four times a day (QID) | RECTAL | Status: DC | PRN

## 2023-12-31 MED ORDER — IPRATROPIUM-ALBUTEROL 0.5-2.5 (3) MG/3ML IN SOLN
3.0000 mL | RESPIRATORY_TRACT | Status: DC | PRN
  Administered 2023-12-31: 3 mL via RESPIRATORY_TRACT
  Filled 2023-12-31: qty 3

## 2023-12-31 MED ORDER — LACTATED RINGERS IV BOLUS
1000.0000 mL | Freq: Once | INTRAVENOUS | Status: AC
  Administered 2023-12-31: 1000 mL via INTRAVENOUS

## 2023-12-31 MED ORDER — HALOPERIDOL LACTATE 5 MG/ML IJ SOLN: 5.0000 mg | Freq: Once | INTRAMUSCULAR | Status: DC

## 2023-12-31 MED ORDER — NOREPINEPHRINE 4 MG/250ML-% IV SOLN
0.0000 ug/min | INTRAVENOUS | Status: DC
  Administered 2023-12-31 (×2): 20 ug/min via INTRAVENOUS
  Administered 2023-12-31: 2 ug/min via INTRAVENOUS
  Filled 2023-12-31 (×3): qty 250

## 2023-12-31 MED ORDER — FENTANYL CITRATE (PF) 50 MCG/ML IJ SOSY: 12.5000 ug | PREFILLED_SYRINGE | INTRAMUSCULAR | Status: DC | PRN

## 2023-12-31 MED ORDER — GLYCOPYRROLATE 0.2 MG/ML IJ SOLN
0.2000 mg | INTRAMUSCULAR | Status: DC | PRN
  Administered 2023-12-31: 0.2 mg via INTRAVENOUS
  Filled 2023-12-31: qty 1

## 2023-12-31 MED ORDER — GLYCOPYRROLATE 1 MG PO TABS: 1.0000 mg | ORAL_TABLET | ORAL | Status: DC | PRN

## 2023-12-31 MED ORDER — POLYVINYL ALCOHOL 1.4 % OP SOLN: 1.0000 [drp] | Freq: Four times a day (QID) | OPHTHALMIC | Status: DC | PRN

## 2023-12-31 NOTE — Plan of Care (Signed)

## 2023-12-31 NOTE — Significant Event (Signed)
 Rapid Response Event Note   Reason for Call :  Hypotension and bradycardia. Patient with transfer orders for ICU.   Initial Focused Assessment:   VS: BP 63/49, HR 45, RR 15, SpO2 94% on room air  Interventions:  -Levophed gtt initiated  Plan of Care:  -[facility] at bedside at 1035- patient transferring to [facility] for ICU care  Event Summary:  MD Notified: Dr. CHARM Sharps Call Time: [id number] Arrival Time: 1000 End Time: 1045  Leonor LITTIE Danker, RN

## 2023-12-31 NOTE — Progress Notes (Signed)
 Bilateral lower extremity venous duplex has been completed. Preliminary results can be found in CV Proc through chart review.   12/31/23 2:47 PM Cathlyn Collet RVT

## 2023-12-31 NOTE — Progress Notes (Signed)
   12/31/23 1600  Spiritual Encounters  Type of Visit Initial  Care provided to: Pt and family  Conversation partners present during encounter Nurse  Referral source Family;Nurse (RN/NT/LPN)  Reason for visit End-of-life  OnCall Visit Yes  Spiritual Framework  Presenting Themes Meaning/purpose/sources of inspiration;Impactful experiences and emotions;Coping tools   Chaplain met with family at bedside two family members and two friends and CHARITY FUNDRAISER.  Spoke to staff and family and provided comfort and spiritual carfe with prayer and counsel.

## 2023-12-31 NOTE — Plan of Care (Signed)
 Cardiology was reconsulted due to concern for atrial fibrillation on telemetry.  After personal review of telemetry it appears the patient has been in normal sinus rhythm and likely junctional rhythm in the setting of mechanical fall, as a dental COVID-19 positive, AKI and severe dehydration.  Atrial fibrillation is likely incorrectly labeled on telemetry.  No further cardiac workup needed.  For remaining cardiac issues, please refer to the prior cardiology consult note from 12/29/2023.  [facility] will sign off at this time.  Otherwise please not hesitate to reach back out with any further questions.  Thank you, Emeline Calender, DO Cardiology

## 2023-12-31 NOTE — Consult Note (Addendum)
 Renal Service Consult Note [facility] Kristy JONETTA Fret, MD  Patient: [name] Date: 12/31/2023 Requesting Physician: Dr. Dennise, SHAUNNA.   Reason for Consult: Renal failure HPI: The patient is a 73 y.o. year-old w/ PMH as below who presented to ED 11/06 per EMS from home where she was found down on the floor. Was down for about 2 days after she fell and couldn't get up. Pt noted sig LE swelling bilat going on for 2 months. In ED BP's were low normal initially, HR 80-90, RR 12-18, gemp 96.6deg. 97% on RA. K+ 5.0, CO2 21, bun 108, creat 4.5, Ca 8.8, alb < 1.5, tbili 3.8, AST 123, ALT 54.  BNP > 4500, CPK 1496, trop 2223. Chol > 1000, TG 648. LA L4.7 , 4.3 , then 2.9.  wBC 10K, Hb 20.4, plt wnl. UA > 300 prot, 50 glu, cloudy, neg LE/ nit. WBC 21-50, RBC 11-20, epi 21-50. Cardiology consulted. ECHO showed eF 55-60%, no valve issues. Blood cx 1/2+ for staph epi, not sure pathogen. Was also COVID +. Morbidly obese. Pt admitted. Pt rec'd 5-6 L of bolus IVFs w/o improvement in creatinine in the 4-5 range. We are asked to see for renal failure.    Pt seen in room. HR 50's, BP 60s/ 30s, pt alert, no oriented very well, knows hospital, not sure year. Denies any SOB, on RA.    ROS - denies CP, no joint pain, no HA, no blurry vision, no rash, no diarrhea, no nausea/ vomiting   Past Medical History  Past Medical History:  Diagnosis Date   Asthma    colon ca dx'd 03/2007   surg only   Hypertension    Past Surgical History  Past Surgical History:  Procedure Laterality Date   COLECTOMY     TUBAL LIGATION     Family History History reviewed. No pertinent family history. Social History  reports that she has quit smoking. Her smoking use included cigarettes. She has never been exposed to tobacco smoke. She has never used smokeless tobacco. She reports that she does not drink alcohol and does not use drugs. Allergies No Known Allergies Home medications Prior to Admission medications    Medication Sig Start Date End Date Taking? Authorizing Provider  Cholecalciferol (VITAMIN D3 PO) Take 2 each by mouth daily. Gummies   Yes [provider]  Cyanocobalamin (VITAMIN B12 PO) Take 1 tablet by mouth daily.   Yes [provider]  Multiple Vitamins-Minerals (WOMENS MULTIVITAMIN PO) Take 1 tablet by mouth daily.   Yes [provider]     Vitals:   12/31/23 1031 12/31/23 1033 12/31/23 1036 12/31/23 1039  BP: (!) 82/53 (!) 77/53 (!) 78/55 (!) 71/58  Pulse: (!) 50 (!) 48 (!) 49 (!) 50  Resp: 19 19 19 17   Temp:      TempSrc:      SpO2: 93% 94% 95% 94%  Weight:      Height:       Exam Gen obese adult female on RA Sclera anicteric, throat clear  No jvd or bruits Chest dec'd at the bases, clear anterior RRR no MR Abd soft ntnd no mass or ascites +bs Ext bilat 2+ LE edema from feet to hips, 1-2+ abd wall edema Neuro is alert, Ox 1.5, deconditioned  Home bp meds: None Other: vitamins  Date   Creat  eGFR (ml/min) 2009- 2012  0.89- 1.10 > 60 ml/min 12/29/23  3.74- 4.50 11/07   4.21   11/08  4.28   UA: UA > 300 prot, neg LE/ nit. WBC 21-50, RBC 11-20, epi 21-50. UNa , UCr pend Renal US  11/07: 12.8/ 12.0 cm kidneys w/ bilat cortical thinning and ^'d echotexture, no hydronephrosis. Foley in bladder.  CXR 11/07: IMPRESSION: New ill-defined hazy opacity at the left lung base, possibly pneumonia or aspiration.   Assessment/ Plan: AKI: b/l creatinine is 0.9- 1.1 from 2012. Here creat is 3.7- 4.2 in setting of being found down, marginal ^CPK, severe hemoconcentration (Hb 20 on admit). Also pt reported 2-3 months of severe LE swelling. Pt rec'd 6-7 liters of IVFs w/ small UOP since admission. Hb improved down to 11, but creat has not improved. Tbili 3-4 range and LFTs mildly elevated and have improved. No pulm edema by CXR. UA showed high protein, also moderate rbc's and ^wbc's. Renal US  showed no obstruction. Alb 1.7. Pt is quite vol overloaded w/  peripheral edema (before and after admission). Pt currently bradycardic and severely hypotensive. Recommend transfer to ICU. May have nephrotic syndrome w/ assoc renal failure. Don't think this is ATN from rhabdo as her CPKs are not very high. Not getting better despite 7 L IVF's here. See if vasopressor can improve her UOP and renal function. May need CRRT. Will follow.  Volume: vol overload w/ peripheral 2+ edema bilat LE's /abd wall. If the BP's improve w/ vasopressors would consider attempting diuresis.  Hypotension: started on levo, moving to ICU, not sure cause. Echo showed wnl LVEF 55-60%.  ALVERNA: is on empiric IV abx, 1/2 staph epi bcx's from admission.  COVID-19+ AMS/ delirium: was conversing well, knows in hospital, not sure year Morbid obesity: BMI > 40       Rob Makynleigh Breslin  MD [facility] 12/31/2023, 11:23 AM  Recent Labs  Lab 12/30/23 1550 12/31/23 0345  CREATININE 4.21* 4.28*  K 4.8 4.0   Inpatient medications:  aspirin EC  81 mg Oral Daily   Chlorhexidine Gluconate Cloth  6 each Topical Daily   fenofibrate  160 mg Oral Daily   heparin  5,000 Units Subcutaneous Q8H   metroNIDAZOLE  500 mg Oral Q8H   rosuvastatin  10 mg Oral Daily    albumin human 25 g (12/31/23 0802)   cefTRIAXone (ROCEPHIN)  IV 1 g (12/30/23 0915)   norepinephrine (LEVOPHED) Adult infusion 6 mcg/min (12/31/23 1041)   acetaminophen **OR** acetaminophen, bisacodyl, oxyCODONE, promethazine

## 2023-12-31 NOTE — Progress Notes (Signed)
 PHARMACY - ANTICOAGULATION CONSULT NOTE  Pharmacy Consult for Heparin Indication: r/o PE   No Known Allergies  Patient Measurements: Height: 5' 5 (165.1 cm) Weight: 125.4 kg (276 lb 7.3 oz) IBW/kg (Calculated) : 57 HEPARIN DW (KG): 87.5  Vital Signs: Temp: 97.1 F (36.2 C) (11/08 1130) Temp Source: Rectal (11/08 1130) BP: 88/39 (11/08 1300) Pulse Rate: 40 (11/08 1300)  Labs: Recent Labs    12/29/23 1400 12/29/23 1420 12/29/23 1548 12/29/23 1715 12/29/23 2327 12/30/23 1550 12/31/23 0345 12/31/23 1154  HGB 20.2*   < >  --   --  17.9*  --  11.8* 13.3  HCT 57.2*   < >  --   --  51.4*  --  32.5* 38.8  PLT 469*  --   --   --  373  --  275 286  LABPROT  --   --   --  23.2*  --   --   --   --   INR  --   --   --  2.0*  --   --   --   --   HEPARINUNFRC  --   --   --   --  0.15*  --   --   --   CREATININE 4.13*   < > 3.74*  --  3.94* 4.21* 4.28* 4.49*  CKTOTAL 1,496*  --   --   --  813*  --  380*  --   TROPONINIHS 2,223*  --  1,704*  --   --   --   --   --    < > = values in this interval not displayed.    Estimated Creatinine Clearance: 14.9 mL/min (A) (by C-G formula based on SCr of 4.49 mg/dL (H)).   Medical History: Past Medical History:  Diagnosis Date   Asthma    colon ca dx'd 03/2007   surg only   Hypertension     Assessment: Active Problem(s): fall found down x 2 days , COVID  AC/Heme: SQ heparin>IV heparin for r/o PE. No anticoag PTA. - Hgb 20.4>17.9>13.3, PLTC [phone], elevated d/t dehydration/hemoconcentration  Goal of Therapy:  Heparin level 0.3-0.7 units/ml Monitor platelets by anticoagulation protocol: Yes   Plan:  Heparin 4000 unit IV bolus Heparin infusion 1500 units/hr Check heparin level in 8 hrs. Daily HL and CBC  Kyllian Clingerman Karoline Marina, PharmD, BCPS Clinical Staff Pharmacist Marina Salines Stillinger 12/31/2023,1:22 PM

## 2023-12-31 NOTE — Progress Notes (Signed)
 Patient began to frequently go into VT with a pulse around 1545. Patient never lost consciousness. Patient reported chest pain to this RN, and SOB was noted. Patient was placed on 4L [place] and Dr Zaida was notified. When MD discussed options with patient, she verbalized to Dr Zaida and RN that she wanted to transition to comfort care. Her sister and nephew were at bedside for this discussion, and supported her decision. Chaplain was consulted by RN and is on the way. RN will prioritize comfort measures at this time, and will discontinue pressors after family has had a chance to talk with chaplain.  RN will continue to offer support to patient and her family.

## 2023-12-31 NOTE — Progress Notes (Signed)
 HOSPITAL MEDICINE OVERNIGHT EVENT NOTE    Notified by nursing that patient is having a rising lactic acid level despite continuous intravenous fluids and multiple lactated ringer fluid boluses.  Chart reviewed, blood pressures seem to be improving, patient on antibiotics, afebrile.  Ordering additional 1 L of lactated ringer bolus to be given over 2 hours followed by resumption of continuous infusion of lactated Ringer solution.  Will order for a repeat lactic acid level to be performed in 3 hours.  Continue to monitor closely.  Kristy JINNY Ba  MD [facility]

## 2023-12-31 NOTE — Progress Notes (Signed)
 Shortly after patient's arrival to ICU, patient was maxed on peripheral levo (10mcg). When Dr Zaida discussed code status and CRRT possibility with patient at bedside, patient was firm on declining these interventions. Patient said she does not want a central line for pressors, she does not want dialysis, and she does not want CPR or intubation. She verbalized understanding to Dr Zaida that without these interventions she might die. She endorses being tired, and not wanting to go on. Patient is oriented x3 at this time.   Dr Zaida attempted to contact patient's brother, but only reached a generic voicemail. MD left message. Dr Zaida consulted palliative and psych to try to find a solution to honor her wishes regarding code status and next steps. TOC also consulted to try to help reach patient's family to gather further information.   In the meantime, verbal order given by Dr Zaida to increase peripheral levo ceiling to 20. Patient's heart rhythm continues to be labile, switching from junctional brady, to junctional with frequent PVCs and beats of VT. RN continuing to closely monitor patient for changes in hemodynamic status. Further labs are pending at this time.

## 2023-12-31 NOTE — Progress Notes (Addendum)
 NAME:  [name], MRN:  [id number], DOB:  1950-02-25, LOS: 2 ADMISSION DATE:  12/29/2023, CONSULTATION DATE:  12/31/23 REFERRING MD:  Dr Dennise CHIEF COMPLAINT:  Shock and renal failure    History of Present Illness:  73 year old female with past medical history of colon cancer 9 years ago, morbid obesity who presented to the hospital after a wellness check from her neighbors revealed that she has been on the floor for 2 days.  She was suspected to be dehydrated and noted to have an acute kidney injury and subsequently was admitted to the floor.  She was noted to also be incidentally COVID-19 positive.  She was suspected to have a possible urinary tract infection and was started on Rocephin.  Her laboratory workup was significant for an acute kidney injury with elevated creatinine and BUN.  Hypocalcemia, CK of 380, BNP more than 4500, troponin 1700.  No leukocytosis.  White count is 8.9 with a normal differential.  Hemoglobin 13.3.  CT head performed showing no acute intracranial abnormality.  Chest x-ray with possible left lower lobe haziness.  She was noted to be more hypotensive and transferred to the ICU for further care.  Here in the ICU, the patient reports that she does not want any invasive measures.  She does not want a central line, or an HD line or an arterial line.  She is not interested in dialysis.  She states that she has been through a lot and is not willing to undergo more invasive procedures or tests.  Although she is only alert and oriented x 2, she seems to have capacity to make these decisions and understands that she might die if she refuses them.  Pertinent  Medical History  As mentioned above  Significant Hospital Events: Including procedures, antibiotic start and stop dates in addition to other pertinent events     Interim History / Subjective:  As above  Objective    Blood pressure (!) 77/47, pulse (!) 50, temperature (!) 97.1 F (36.2 C), temperature  source Rectal, resp. rate 17, height 5' 5 (1.651 m), weight 125.4 kg, SpO2 95%.        Intake/Output Summary (Last 24 hours) at 12/31/2023 1252 Last data filed at 12/31/2023 1144 Gross per 24 hour  Intake 2322.52 ml  Output 100 ml  Net 2222.52 ml   Filed Weights   12/29/23 1816 12/29/23 2054 12/31/23 1130  Weight: 117.9 kg 121.4 kg 125.4 kg    Examination: General: Elderly female, no acute distress Lungs: Clear to auscultation bilaterally Cardiovascular: Bradycardic, normal S1, normal S2 Abdomen: Soft, obese Extremities: +2 edema Neuro: Alert and oriented x 2   Resolved problem list   Assessment and Plan   # Undifferentiated shock, concern for PE with elevated BNP and troponin, on the ground for 2 days # Presumed left lower lobe pneumonia # COVID infection - Infectious workup so far unrevealing.  Blood cultures from 2 days ago with staph epi growth in 1 bottle.  Chest x-ray unrevealing although could possibly have a left lower lobe pneumonia.  She is currently on Rocephin.SABRA  UA unrevealing. - Echocardiogram with good ejection fraction, no great windows to evaluate RV function - CT PE was held off due to kidney function - Will check lower extremity Doppler - Will start empiric heparin for presumed PE/ACS - Although COVID-positive, no pulmonary symptoms to suggest an active infection, she is not hypoxic at this time. - Holding off steroids at this time   # Acute kidney  injury with anuria - Patient with very poor urine output.  Renal ultrasound with no evidence of obstruction and concern for medical renal disease (CKD) - No urgent indication for dialysis, normal potassium level, not severely acidotic, although appears volume overloaded she is not in acute hypoxic respiratory failure and is on room air without evidence of respiratory distress  # Bilateral lower extremity swelling # Type II NSTEMI versus ACS # Acute diastolic congestive heart failure Patient with evidence of  volume overload with +2 lower extremity swelling.  She is +3 L this admission.  Echocardiogram with LVEF of 50 to 55%.    Goals of Care Extensive goals of care discussions with the patient and her sister at bedside.  Patient states that she does not want any invasive procedures including central line, A-line, HD line or dialysis.  She is okay with continuing peripheral IV medications and oxygen through nasal cannula.  She does not want any chest compressions or intubation.  CODE STATUS changed to DNR/DNI.  Will continue current medical care with possible transition to comfort measures if she has further deterioration in her hemodynamic status.   Follow up conversation with patient and her family, she elected to have comfort measures only.  Orders changed to reflect those wishes.      Labs   CBC: Recent Labs  Lab 12/29/23 1400 12/29/23 1420 12/29/23 2327 12/31/23 0345 12/31/23 1154  WBC 10.5  --  11.8* 9.8 8.9  NEUTROABS 8.4*  --  8.6* 5.7  --   HGB 20.2* 20.4* 17.9* 11.8* 13.3  HCT 57.2* 60.0* 51.4* 32.5* 38.8  MCV 86.8  --  88.3 85.3 89.0  PLT 469*  --  373 275 286    Basic Metabolic Panel: Recent Labs  Lab 12/29/23 1400 12/29/23 1420 12/29/23 1548 12/29/23 2327 12/30/23 1550 12/31/23 0345  NA 136 134*  --  135 133* 135  K 4.7 5.0  --  4.8 4.8 4.0  CL 90* 104  --  96* 96* 97*  CO2 21*  --   --  20* 20* 21*  GLUCOSE 125* 124*  --  118* 120* 82  BUN 97* 108*  --  102* 103* 104*  CREATININE 4.13* 4.50* 3.74* 3.94* 4.21* 4.28*  CALCIUM 8.8*  --   --  8.0* 7.9* 8.1*  MG  --   --   --  2.4  --  2.1  PHOS  --   --   --  9.1*  --  6.9*   GFR: Estimated Creatinine Clearance: 15.6 mL/min (A) (by C-G formula based on SCr of 4.28 mg/dL (H)). Recent Labs  Lab 12/29/23 1400 12/29/23 1420 12/29/23 1604 12/29/23 1726 12/29/23 2327 12/30/23 1550 12/30/23 2309 12/31/23 0345 12/31/23 1154  PROCALCITON  --   --   --  3.32 3.85  --   --  3.27  --   WBC 10.5  --   --   --   11.8*  --   --  9.8 8.9  LATICACIDVEN  --    < > 4.3*  --   --  2.9* 3.5* 1.9  --    < > = values in this interval not displayed.    Liver Function Tests: Recent Labs  Lab 12/29/23 1400 12/29/23 2327 12/31/23 0345  AST 123* 107* 71*  ALT 54* 50* 35  ALKPHOS 425* 347* 223*  BILITOT 3.8* 3.2* 3.6*  PROT 5.0* 4.2* 3.8*  ALBUMIN <1.5* <1.5* 2.0*   No results for input(s): LIPASE,  AMYLASE in the last 168 hours. No results for input(s): AMMONIA in the last 168 hours.  ABG    Component Value Date/Time   TCO2 27 12/29/2023 1420     Coagulation Profile: Recent Labs  Lab 12/29/23 1715  INR 2.0*    Cardiac Enzymes: Recent Labs  Lab 12/29/23 1400 12/29/23 2327 12/31/23 0345  CKTOTAL 1,496* 813* 380*    HbA1C: No results found for: HGBA1C  CBG: Recent Labs  Lab 12/30/23 0308  GLUCAP 107*    Review of Systems:   As above   Past Medical History:  She,  has a past medical history of Asthma, colon ca (dx'd 03/2007), and Hypertension.   Surgical History:   Past Surgical History:  Procedure Laterality Date   COLECTOMY     TUBAL LIGATION       Social History:   reports that she has quit smoking. Her smoking use included cigarettes. She has never been exposed to tobacco smoke. She has never used smokeless tobacco. She reports that she does not drink alcohol and does not use drugs.   Family History:  Her family history is not on file.   Allergies No Known Allergies   Home Medications  Prior to Admission medications   Medication Sig Start Date End Date Taking? Authorizing Provider  Cholecalciferol (VITAMIN D3 PO) Take 2 each by mouth daily. Gummies   Yes [provider]  Cyanocobalamin (VITAMIN B12 PO) Take 1 tablet by mouth daily.   Yes [provider]  Multiple Vitamins-Minerals (WOMENS MULTIVITAMIN PO) Take 1 tablet by mouth daily.   Yes [provider]     Critical care time: 15    The patient is critically ill due  to shock requiring vasopressors.  Critical care was necessary to treat or prevent imminent or life-threatening deterioration. Critical care time was spent by me on the following activities: development of a treatment plan with the patient and/or surrogate as well as nursing, discussions with consultants, evaluation of the patient's response to treatment, examination of the patient, obtaining a history from the patient or surrogate, ordering and performing treatments and interventions, ordering and review of laboratory studies, ordering and review of radiographic studies, review of telemetry data including pulse oximetry, re-evaluation of patient's condition and participation in multidisciplinary rounds.   I personally spent 54 minutes providing critical care not including any separately billable procedures.   Zola LOISE Herter, MD [facility] 12/31/2023 2:26 PM

## 2023-12-31 NOTE — Progress Notes (Addendum)
 PROGRESS NOTE                                                                                                                                                                                                             Patient Demographics:    [name], is a 73 y.o. female, DOB - Mar 31, 1950, FMW:[id number]  Outpatient Primary MD for the patient is Pcp, No    LOS - 2  Admit date - 12/29/2023    Chief Complaint  Patient presents with   Fall       Brief Narrative (HPI from H&P)   73 y.o. female, who is in relatively good health except for arthritis, poor balance uses walker, history of colon cancer 9 years ago which was treated with resection and has resolved since then, morbid obesity, last medical checkup 2 years ago and it was normal, on no home medications who lives alone, who fell out of the bed while getting about 2 days ago, she was subsequently unable to get up, neighbors did a welfare check on her and found her on the floor.  EMS was called and she was brought to the ER.   In the ER patient was relatively symptom-free except she was extremely tired and weak, head CT was nonacute, she had no headache or focal deficits, no subjective complaints, workup was suggestive of severe dehydration, hypotension, rhabdomyolysis, AKI, elevated troponin nonspecific EKG changes.  Cardiology was consulted by the ER physician cardiology recommended medical management and an echocardiogram, I was called to admit the patient.   Patient is currently absolutely symptom-free except for some fatigue and tiredness, denies any headache, no chest pain or palpitations, no fever chills no cough, no abdominal pain, no diarrhea or dysuria or focal weakness.  No joint pains or aches.  Incidentally COVID-19 test done in the ER has come back positive.   Subjective:   Patient in bed, appears comfortable, denies any headache, no fever, no chest pain or  pressure, no shortness of breath , no abdominal pain. No focal weakness.   Assessment  & Plan :   1.  Mechanical fall, down on the floor for 2 days, exposure to elements, dehydration, hypotension, rhabdomyolysis, AKI.  She will be admitted to progressive care unit, she is extremely dehydrated with hypoperfusion and elevated lactic acid levels, no headache, no focal  deficits, no palpitations or chest pain, CT head and C-spine unremarkable, clinically no infection or sepsis.  She will be aggressively hydrated with IV fluids, echo appears satisfactory, unfortunately despite aggressive IV fluid hydration and infusion of albumin her urine output and renal function have not improved, likely will require a trial of diuretics and if it fails may end up on HD, nephrology consulted 12/31/2023.  Rhabdomyolysis has improved and blood pressure has improved.  Has been seen by PCCM, cardiology and now will be seen by nephrology as well.   2.  Severe dehydration, hypotension, elevated lactic acid, hypoperfusion related some demand ischemia and elevated troponin.  All due to above, seen by cardiology, echo satisfactory with preserved EF and no wall motion abnormality, likely mild demand ischemia from hypotension, on aspirin, chest pain-free, EKG stable.  No further workup for troponin elevation.  Trend is stable.   3.  Junctional rhythm  with slow ventricular response.  Started morning of 12/31/2023, hold midodrine due to slow ventricular response/bradycardia, repeat TSH and free T4, echo already done and noted, cussed with cardiology nothing else to offer, kindly see note from Dr. Reeta.   4.  COVID-19 positive.  Likely incidental.  No cough or fever, supportive care, check CRP and Pro-Cal, new airborne and contact isolation, initial chest x-ray stable, now developing left lower lobe infiltrate which could be atelectasis versus some aspiration when she was on the floor for 2 days, on empiric antibiotics, speech therapy  to evaluate, afebrile, stable CRP, no cough shortness of breath or any other pulmonary symptoms at this time.   5.  Relative polycythemia.  Due to hemoconcentration, improving with hydration.   6.  Possible UTI.  UA obtained in the ER is borderline, no dysuria, antibiotics covering for pneumonia above and UTI.  Monitor.  7.  Mild delirium.  Combination of dehydration, advanced age, being in the hospital and likely due to incidental COVID causing minimal inflammation.  Likely metabolic encephalopathy.  Supportive care, head CT unremarkable, seen by neuro night of 12/29/2023, no headache or focal deficits.  Continue supportive care, minimize narcotics and benzodiazepines, treat infections as above and monitor.  8.  Morbid obesity, BMI greater than 40, decreased mobility.  PT eval may require placement.  Follow-up with PCP for weight loss.      Condition - Extremely Guarded  Family Communication  : Family friend at bedside on the day of admission 12/29/2023 none present on 12/30/2023, sister updated over the phone 12/30/2023  Code Status : Full code  Consults  : Neurology on the night of 12/29/2023, PCCM, nephrology, cardiology  PUD Prophylaxis :    Procedures  :     CT head and C-spine.  Nonacute.  Echocardiogram. 1. Left ventricular ejection fraction, by estimation, is 55 to 60%. The left ventricle has normal function. The left ventricle has no regional wall motion abnormalities. There is severe concentric left ventricular hypertrophy. Left ventricular diastolic  parameters are consistent with Grade I diastolic dysfunction (impaired relaxation).  2. Right ventricular systolic function is normal. The right ventricular size is not well visualized. Tricuspid regurgitation signal is inadequate for assessing PA pressure.  3. The mitral valve is grossly normal. Trivial mitral valve regurgitation.  4. The aortic valve is tricuspid. Aortic valve regurgitation is not visualized. Aortic valve sclerosis is  present, with no evidence of aortic valve stenosis.  5. The inferior vena cava is normal in size with greater than 50% respiratory variability, suggesting right atrial pressure of 3 mmHg.  Disposition Plan  :    Status is: Inpatient  DVT Prophylaxis  :    Place TED hose Start: 12/30/23 1304 heparin injection 5,000 Units Start: 12/29/23 1545    Lab Results  Component Value Date   PLT 275 12/31/2023    Diet :  Diet Order             Diet regular Room service appropriate? Yes with Assist; Fluid consistency: Thin  Diet effective now                    Inpatient Medications  Scheduled Meds:  aspirin EC  81 mg Oral Daily   Chlorhexidine Gluconate Cloth  6 each Topical Daily   fenofibrate  160 mg Oral Daily   heparin  5,000 Units Subcutaneous Q8H   metroNIDAZOLE  500 mg Oral Q8H   rosuvastatin  10 mg Oral Daily   Continuous Infusions:  albumin human 25 g (12/31/23 0125)   cefTRIAXone (ROCEPHIN)  IV 1 g (12/30/23 0915)   PRN Meds:.acetaminophen **OR** acetaminophen, bisacodyl, oxyCODONE, promethazine  Antibiotics  :    Anti-infectives (From admission, onward)    Start     Dose/Rate Route Frequency Ordered Stop   12/30/23 1000  cefTRIAXone (ROCEPHIN) 1 g in sodium chloride 0.9 % 100 mL IVPB        1 g 200 mL/hr over 30 Minutes Intravenous Every 24 hours 12/29/23 1615 01/02/24 0959   12/30/23 0800  metroNIDAZOLE (FLAGYL) tablet 500 mg        500 mg Oral Every 8 hours 12/30/23 0701     12/29/23 1345  ceFEPIme (MAXIPIME) 2 g in sodium chloride 0.9 % 100 mL IVPB        2 g 200 mL/hr over 30 Minutes Intravenous  Once 12/29/23 1340 12/29/23 1503   12/29/23 1345  metroNIDAZOLE (FLAGYL) IVPB 500 mg        500 mg 100 mL/hr over 60 Minutes Intravenous  Once 12/29/23 1340 12/29/23 1606   12/29/23 1345  vancomycin (VANCOCIN) IVPB 1000 mg/200 mL premix        1,000 mg 200 mL/hr over 60 Minutes Intravenous  Once 12/29/23 1340 12/29/23 1716         Objective:    Vitals:   12/30/23 1900 12/30/23 1950 12/30/23 2340 12/31/23 0000  BP: (!) 90/58 (!) 87/66 96/62 99/64   Pulse: 82  84 82  Resp: 16  17 14   Temp:  98.7 F (37.1 C) 97.8 F (36.6 C)   TempSrc:  Oral Oral   SpO2: 96%  95%   Weight:      Height:        Wt Readings from Last 3 Encounters:  12/29/23 121.4 kg     Intake/Output Summary (Last 24 hours) at 12/31/2023 0723 Last data filed at 12/30/2023 2000 Gross per 24 hour  Intake 2500 ml  Output 50 ml  Net 2450 ml     Physical Exam  Awake , minimally confused, oriented x 2, No new F.N deficits, Normal affect [place].AT,PERRAL Supple Neck, No JVD,   Symmetrical Chest wall movement, Good air movement bilaterally, bibasilar crackles Regular RRR,No Gallops,Rubs or new Murmurs,  +ve B.Sounds, Abd Soft, No tenderness,   No Cyanosis, Clubbing or edema      Data Review:    Recent Labs  Lab 12/29/23 1400 12/29/23 1420 12/29/23 2327 12/31/23 0345  WBC 10.5  --  11.8* 9.8  HGB 20.2* 20.4* 17.9* 11.8*  HCT 57.2* 60.0* 51.4*  32.5*  PLT 469*  --  373 275  MCV 86.8  --  88.3 85.3  MCH 30.7  --  30.8 31.0  MCHC 35.3  --  34.8 36.3*  RDW 19.4*  --  19.5* 17.8*  LYMPHSABS 1.6  --  2.5 3.0  MONOABS 0.4  --  0.6 0.7  EOSABS 0.0  --  0.0 0.2  BASOSABS 0.0  --  0.0 0.0    Recent Labs  Lab 12/29/23 1400 12/29/23 1420 12/29/23 1548 12/29/23 1604 12/29/23 1715 12/29/23 1726 12/29/23 2327 12/30/23 1550 12/30/23 2309 12/31/23 0345  NA 136 134*  --   --   --   --  135 133*  --  135  K 4.7 5.0  --   --   --   --  4.8 4.8  --  4.0  CL 90* 104  --   --   --   --  96* 96*  --  97*  CO2 21*  --   --   --   --   --  20* 20*  --  21*  ANIONGAP 25*  --   --   --   --   --  19* 17*  --  17*  GLUCOSE 125* 124*  --   --   --   --  118* 120*  --  82  BUN 97* 108*  --   --   --   --  102* 103*  --  104*  CREATININE 4.13* 4.50* 3.74*  --   --   --  3.94* 4.21*  --  4.28*  AST 123*  --   --   --   --   --  107*  --   --  71*  ALT 54*  --    --   --   --   --  50*  --   --  35  ALKPHOS 425*  --   --   --   --   --  347*  --   --  223*  BILITOT 3.8*  --   --   --   --   --  3.2*  --   --  3.6*  ALBUMIN <1.5*  --   --   --   --   --  <1.5*  --   --  2.0*  CRP  --   --   --   --   --  0.8 1.1*  --   --  1.1*  PROCALCITON  --   --   --   --   --  3.32 3.85  --   --  3.27  LATICACIDVEN  --  4.7*  --  4.3*  --   --   --  2.9* 3.5* 1.9  INR  --   --   --   --  2.0*  --   --   --   --   --   TSH  --   --   --   --   --   --   --  9.544*  --   --   BNP  --   --   --   --   --   --  >4,500.0*  --   --   --   MG  --   --   --   --   --   --  2.4  --   --  2.1  PHOS  --   --   --   --   --   --  9.1*  --   --  6.9*  CALCIUM 8.8*  --   --   --   --   --  8.0* 7.9*  --  8.1*      Recent Labs  Lab 12/29/23 1400 12/29/23 1420 12/29/23 1604 12/29/23 1715 12/29/23 1726 12/29/23 2327 12/30/23 1550 12/30/23 2309 12/31/23 0345  CRP  --   --   --   --  0.8 1.1*  --   --  1.1*  PROCALCITON  --   --   --   --  3.32 3.85  --   --  3.27  LATICACIDVEN  --  4.7* 4.3*  --   --   --  2.9* 3.5* 1.9  INR  --   --   --  2.0*  --   --   --   --   --   TSH  --   --   --   --   --   --  9.544*  --   --   BNP  --   --   --   --   --  >4,500.0*  --   --   --   MG  --   --   --   --   --  2.4  --   --  2.1  CALCIUM 8.8*  --   --   --   --  8.0* 7.9*  --  8.1*    --------------------------------------------------------------------------------------------------------------- Lab Results  Component Value Date   CHOL >1,000 (H) 12/30/2023   HDL 23 (L) 12/30/2023   LDLCALC UNABLE TO CALCULATE IF TRIGLYCERIDE OVER 400 mg/dL 88/92/7974   TRIG 351 (H) 12/30/2023   CHOLHDL NOT CALCULATED 12/30/2023    No results found for: HGBA1C Recent Labs    12/30/23 1550  TSH 9.544*  FREET4 1.23*   Recent Labs    12/30/23 0938  VITAMINB12 >4,000*    ------------------------------------------------------------------------------------------------------------------ Cardiac Enzymes No results for input(s): CKMB, TROPONINI, MYOGLOBIN in the last 168 hours.  Invalid input(s): CK  Micro Results Recent Results (from the past 240 hours)  Blood Culture (routine x 2)     Status: None (Preliminary result)   Collection Time: 12/29/23  1:45 PM   Specimen: BLOOD  Result Value Ref Range Status   Specimen Description BLOOD LEFT ANTECUBITAL  Final   Special Requests   Final    BOTTLES DRAWN AEROBIC AND ANAEROBIC Blood Culture adequate volume   Culture  Setup Time   Final    GRAM POSITIVE COCCI IN CLUSTERS AEROBIC BOTTLE ONLY Organism ID to follow CRITICAL RESULT CALLED TO, READ BACK BY AND VERIFIED WITH: E. SINCLAIR PHARMD, AT 1110 12/30/23 D. VANHOOK Performed at [facility], [address] [place], [place] [address]    Culture GRAM POSITIVE COCCI  Final   Report Status PENDING  Incomplete  Blood Culture ID Panel (Reflexed)     Status: Abnormal   Collection Time: 12/29/23  1:45 PM  Result Value Ref Range Status   Enterococcus faecalis NOT DETECTED NOT DETECTED Final   Enterococcus Faecium NOT DETECTED NOT DETECTED Final   Listeria monocytogenes NOT DETECTED NOT DETECTED Final   Staphylococcus species DETECTED (A) NOT DETECTED Final    Comment: CRITICAL RESULT CALLED TO, READ BACK BY AND VERIFIED WITH: E. SINCLAIR PHARMD, AT 1110 12/30/23 D. VANHOOK  Staphylococcus aureus (BCID) NOT DETECTED NOT DETECTED Final   Staphylococcus epidermidis DETECTED (A) NOT DETECTED Final    Comment: Methicillin (oxacillin) resistant coagulase negative staphylococcus. Possible blood culture contaminant (unless isolated from more than one blood culture draw or clinical case suggests pathogenicity). No antibiotic treatment is indicated for blood  culture contaminants. CRITICAL RESULT CALLED TO, READ BACK BY AND VERIFIED WITH: E. SINCLAIR  PHARMD, AT 1110 12/30/23 D. VANHOOK    Staphylococcus lugdunensis NOT DETECTED NOT DETECTED Final   Streptococcus species NOT DETECTED NOT DETECTED Final   Streptococcus agalactiae NOT DETECTED NOT DETECTED Final   Streptococcus pneumoniae NOT DETECTED NOT DETECTED Final   Streptococcus pyogenes NOT DETECTED NOT DETECTED Final   A.calcoaceticus-baumannii NOT DETECTED NOT DETECTED Final   Bacteroides fragilis NOT DETECTED NOT DETECTED Final   Enterobacterales NOT DETECTED NOT DETECTED Final   Enterobacter cloacae complex NOT DETECTED NOT DETECTED Final   Escherichia coli NOT DETECTED NOT DETECTED Final   Klebsiella aerogenes NOT DETECTED NOT DETECTED Final   Klebsiella oxytoca NOT DETECTED NOT DETECTED Final   Klebsiella pneumoniae NOT DETECTED NOT DETECTED Final   Proteus species NOT DETECTED NOT DETECTED Final   Salmonella species NOT DETECTED NOT DETECTED Final   Serratia marcescens NOT DETECTED NOT DETECTED Final   Haemophilus influenzae NOT DETECTED NOT DETECTED Final   Neisseria meningitidis NOT DETECTED NOT DETECTED Final   Pseudomonas aeruginosa NOT DETECTED NOT DETECTED Final   Stenotrophomonas maltophilia NOT DETECTED NOT DETECTED Final   Candida albicans NOT DETECTED NOT DETECTED Final   Candida auris NOT DETECTED NOT DETECTED Final   Candida glabrata NOT DETECTED NOT DETECTED Final   Candida krusei NOT DETECTED NOT DETECTED Final   Candida parapsilosis NOT DETECTED NOT DETECTED Final   Candida tropicalis NOT DETECTED NOT DETECTED Final   Cryptococcus neoformans/gattii NOT DETECTED NOT DETECTED Final   Methicillin resistance mecA/C DETECTED (A) NOT DETECTED Final    Comment: CRITICAL RESULT CALLED TO, READ BACK BY AND VERIFIED WITH: E. SINCLAIR PHARMD, AT 1110 12/30/23 D. VANHOOK Performed at [facility], [address], [place], [place] [address]   Resp panel by RT-PCR (RSV, Flu A&B, Covid) Anterior Nasal Swab     Status: Abnormal   Collection Time: 12/29/23  2:00  PM   Specimen: Anterior Nasal Swab  Result Value Ref Range Status   SARS Coronavirus 2 by RT PCR POSITIVE (A) NEGATIVE Final   Influenza A by PCR NEGATIVE NEGATIVE Final   Influenza B by PCR NEGATIVE NEGATIVE Final    Comment: (NOTE) The Xpert Xpress SARS-CoV-2/FLU/RSV plus assay is intended as an aid in the diagnosis of influenza from Nasopharyngeal swab specimens and should not be used as a sole basis for treatment. Nasal washings and aspirates are unacceptable for Xpert Xpress SARS-CoV-2/FLU/RSV testing.  Fact Sheet for Patients: bloggercourse.com  Fact Sheet for Healthcare Providers: seriousbroker.it  This test is not yet approved or cleared by the [place]  FDA and has been authorized for detection and/or diagnosis of SARS-CoV-2 by FDA under an Emergency Use Authorization (EUA). This EUA will remain in effect (meaning this test can be used) for the duration of the COVID-19 declaration under Section 564(b)(1) of the Act, 21 U.S.C. section 360bbb-3(b)(1), unless the authorization is terminated or revoked.     Resp Syncytial Virus by PCR NEGATIVE NEGATIVE Final    Comment: (NOTE) Fact Sheet for Patients: bloggercourse.com  Fact Sheet for Healthcare Providers: seriousbroker.it  This test is not yet approved or cleared by the  [place]  FDA and has been authorized for detection and/or diagnosis of SARS-CoV-2 by FDA under an Emergency Use Authorization (EUA). This EUA will remain in effect (meaning this test can be used) for the duration of the COVID-19 declaration under Section 564(b)(1) of the Act, 21 U.S.C. section 360bbb-3(b)(1), unless the authorization is terminated or revoked.  Performed at [facility], [address] [place], [place] [address]   Blood Culture (routine x 2)     Status: None (Preliminary result)   Collection Time: 12/29/23  3:49 PM    Specimen: BLOOD  Result Value Ref Range Status   Specimen Description BLOOD RIGHT ANTECUBITAL  Final   Special Requests   Final    BOTTLES DRAWN AEROBIC AND ANAEROBIC Blood Culture adequate volume   Culture   Final    NO GROWTH < 24 HOURS Performed at [facility], [address], [place], [place] [address]    Report Status PENDING  Incomplete    Radiology Report US  RENAL Result Date: 12/30/2023 EXAM: US  Retroperitoneum Complete, Renal. CLINICAL HISTORY: AKI (acute kidney injury). TECHNIQUE: Real-time ultrasound of the retroperitoneum (complete) with image documentation. COMPARISON: None provided. FINDINGS: RIGHT KIDNEY: Bilateral cortical thinning and increased parenchymal echogenicity. The right kidney measures 12.8 x 5.5 x 4.4 cm. Volume 161 ml. A 2.3 cm avascular cyst in the right kidney. No follow up recommended. No hydronephrosis or renal stone visualized. LEFT KIDNEY: Bilateral cortical thinning and increased parenchymal echogenicity. The left kidney measures 12.0 x 6.2 x 5.3 cm. Volume 205 ml. No hydronephrosis, renal stone, or mass visualized. BLADDER: Foley catheter in the bladder. IMPRESSION: 1. Bilateral cortical thinning and increased parenchymal echogenicity in the kidneys, consistent with medical renal disease. Electronically signed by: Norman Gatlin MD 12/30/2023 11:07 PM EST RP Workstation: [id number]   DG Chest Port 1 View Result Date: 12/30/2023 EXAM: 1 VIEW(S) XRAY OF THE CHEST 12/30/2023 06:35:49 AM COMPARISON: 12/29/2023 CLINICAL HISTORY: SOB (shortness of breath) FINDINGS: LUNGS AND PLEURA: An ill-defined hazy opacity is noted within the left lung base and appears new from the prior exam. No pulmonary edema. No pleural effusion. No pneumothorax. HEART AND MEDIASTINUM: No acute abnormality of the cardiac and mediastinal silhouettes. BONES AND SOFT TISSUES: No acute osseous abnormality. IMPRESSION: 1. New ill-defined hazy opacity at the left lung base, new from prior  exam, suspicious for acute airspace disease such as pneumonia or aspiration. Electronically signed by: Waddell Calk MD 12/30/2023 06:44 AM EST RP Workstation: [id number]   CT HEAD WO CONTRAST ( ) Result Date: 12/30/2023 EXAM: CT HEAD WITHOUT CONTRAST 12/30/2023 04:35:01 AM TECHNIQUE: CT of the head was performed without the administration of intravenous contrast. Automated exposure control, iterative reconstruction, and/or weight based adjustment of the mA/kV was utilized to reduce the radiation dose to as low as reasonably achievable. COMPARISON: Head CT dated 12/29/2023. CLINICAL HISTORY: Mental status change, unknown cause. 73 year old female recent fall. Altered mental status. FINDINGS: BRAIN AND VENTRICLES: No acute hemorrhage. No evidence of acute infarct. No hydrocephalus. No extra-axial collection. No mass effect or midline shift. Chronic lacunar infarct left basal ganglia is stable. Patchy and scattered additional bilateral cerebral white matter hypodensity is stable, mild to moderate for age. Additional heterogeneity in the bilateral deep gray nuclei compatible with chronic small vessel disease appears stable. Stable gray white differentiation. No suspicious intracranial vascular hyperdensity. ORBITS: No acute abnormality. SINUSES: Visible paranasal sinuses, middle ears and mastoids remain well aerated. SOFT TISSUES AND SKULL: No acute soft tissue abnormality. No  skull fracture. Calcified atherosclerosis at the skull base. IMPRESSION: 1. No acute intracranial abnormality. 2. Stable  non contrast CT appearance of chronic small vessel disease. Electronically signed by: Helayne Hurst MD 12/30/2023 05:45 AM EST RP Workstation: [id number]   ECHOCARDIOGRAM COMPLETE Result Date: 12/29/2023    ECHOCARDIOGRAM REPORT   Patient Name:   [name] Date of Exam: 12/29/2023 Medical Rec #:  [id number]        Height:       65.0 in Accession #:    [id number]       Weight:       260.0 lb Date of Birth:  06/24/1950          BSA:          2.211 m Patient Age:    73 years         BP:           124/88 mmHg Patient Gender: F                HR:           89 bpm. Exam Location:  Inpatient Procedure: 2D Echo, Cardiac Doppler and Color Doppler (Both Spectral and Color            Flow Doppler were utilized during procedure). Indications:    Elevated Troponin  History:        Patient has no prior history of Echocardiogram examinations.  Sonographer:    Damien Senior RDCS Referring Phys: MAUDE JAYSON EMMER  Sonographer Comments: Technically difficult study due to patient body habitus. IMPRESSIONS  1. Left ventricular ejection fraction, by estimation, is 55 to 60%. The left ventricle has normal function. The left ventricle has no regional wall motion abnormalities. There is severe concentric left ventricular hypertrophy. Left ventricular diastolic  parameters are consistent with Grade I diastolic dysfunction (impaired relaxation).  2. Right ventricular systolic function is normal. The right ventricular size is not well visualized. Tricuspid regurgitation signal is inadequate for assessing PA pressure.  3. The mitral valve is grossly normal. Trivial mitral valve regurgitation.  4. The aortic valve is tricuspid. Aortic valve regurgitation is not visualized. Aortic valve sclerosis is present, with no evidence of aortic valve stenosis.  5. The inferior vena cava is normal in size with greater than 50% respiratory variability, suggesting right atrial pressure of 3 mmHg. FINDINGS  Left Ventricle: Left ventricular ejection fraction, by estimation, is 55 to 60%. The left ventricle has normal function. The left ventricle has no regional wall motion abnormalities. Definity contrast agent was given IV to delineate the left ventricular  endocardial borders. Strain was performed and the global longitudinal strain is indeterminate. The left ventricular internal cavity size was normal in size. There is severe concentric left ventricular hypertrophy. Left  ventricular diastolic parameters are consistent with Grade I diastolic dysfunction (impaired relaxation). Right Ventricle: The right ventricular size is not well visualized. Right vetricular wall thickness was not well visualized. Right ventricular systolic function is normal. Tricuspid regurgitation signal is inadequate for assessing PA pressure. Left Atrium: Left atrial size was normal in size. Right Atrium: Right atrial size was not well visualized. Pericardium: Trivial pericardial effusion is present. Mitral Valve: The mitral valve is grossly normal. Trivial mitral valve regurgitation. Tricuspid Valve: The tricuspid valve is grossly normal. Tricuspid valve regurgitation is not demonstrated. Aortic Valve: The aortic valve is tricuspid. Aortic valve regurgitation is not visualized. Aortic valve sclerosis is present, with no evidence of aortic valve stenosis. Pulmonic Valve: The pulmonic  valve was grossly normal. Pulmonic valve regurgitation is not visualized. Aorta: The aortic root and ascending aorta are structurally normal, with no evidence of dilitation. Venous: The inferior vena cava is normal in size with greater than 50% respiratory variability, suggesting right atrial pressure of 3 mmHg. IAS/Shunts: No atrial level shunt detected by color flow Doppler. Additional Comments: 3D was performed not requiring image post processing on an independent workstation and was indeterminate.  LEFT VENTRICLE PLAX 2D LVIDd:         3.00 cm   Diastology LVIDs:         2.10 cm   LV e' medial:    3.70 cm/s LV PW:         1.90 cm   LV E/e' medial:  16.3 LV IVS:        1.90 cm   LV e' lateral:   3.92 cm/s LVOT diam:     2.20 cm   LV E/e' lateral: 15.4 LV SV:         77 LV SV Index:   35 LVOT Area:     3.80 cm  RIGHT VENTRICLE RV S prime:     11.60 cm/s TAPSE (M-mode): 1.6 cm LEFT ATRIUM             Index LA diam:        3.00 cm 1.36 cm/m LA Vol (A2C):   44.8 ml 20.26 ml/m LA Vol (A4C):   40.5 ml 18.31 ml/m LA Biplane Vol:  43.9 ml 19.85 ml/m  AORTIC VALVE LVOT Vmax:   141.00 cm/s LVOT Vmean:  108.000 cm/s LVOT VTI:    0.203 m  AORTA Ao Root diam: 2.90 cm Ao Asc diam:  3.50 cm MITRAL VALVE MV Area (PHT): 3.07 cm     SHUNTS MV Decel Time: 247 msec     Systemic VTI:  0.20 m MV E velocity: 60.20 cm/s   Systemic Diam: 2.20 cm MV A velocity: 102.00 cm/s MV E/A ratio:  0.59 Maude Emmer MD Electronically signed by Maude Emmer MD Signature Date/Time: 12/29/2023/4:18:35 PM    Final    DG Chest Port 1 View Result Date: 12/29/2023 CLINICAL DATA:  Sepsis. EXAM: PORTABLE CHEST 1 VIEW COMPARISON:  Chest radiograph dated 04/18/2007. FINDINGS: Minimal left lung base atelectasis. No focal consolidation, pleural effusion or pneumothorax. Thickened right perihilar and paramediastinal tissue of indeterminate etiology. Further evaluation with chest CT is recommended. Stable cardiomegaly. No acute osseous pathology. IMPRESSION: 1. No focal consolidation. 2. Thickened right perihilar and paramediastinal tissue of indeterminate etiology. Further evaluation with chest CT is recommended. Electronically Signed   By: Vanetta Chou M.D.   On: 12/29/2023 16:13   CT Cervical Spine Wo Contrast Result Date: 12/29/2023 CLINICAL DATA:  Clemens out of bed 2 days ago, found down EXAM: CT CERVICAL SPINE WITHOUT CONTRAST TECHNIQUE: Multidetector CT imaging of the cervical spine was performed without intravenous contrast. Multiplanar CT image reconstructions were also generated. RADIATION DOSE REDUCTION: This exam was performed according to the departmental dose-optimization program which includes automated exposure control, adjustment of the mA and/or kV according to patient size and/or use of iterative reconstruction technique. COMPARISON:  None Available. FINDINGS: Alignment: Mild right convex curvature at the cervicothoracic junction. Minimal degenerative anterolisthesis of C7 on T1. Skull base and vertebrae: No acute fracture. No primary bone lesion or focal  pathologic process. Soft tissues and spinal canal: No prevertebral fluid or swelling. No visible canal hematoma. Disc levels: Multilevel cervical spondylosis most pronounced at C4-5, C5-6,  and C6-7. Lower cervical facet hypertrophic changes greatest at C7-T1. Upper chest: Airway is patent.  Lung apices are clear. Other: Reconstructed images demonstrate no additional findings. IMPRESSION: 1. No acute cervical spine fracture. 2. Multilevel lower cervical spondylosis and facet hypertrophy as above. Electronically Signed   By: Ozell Daring M.D.   On: 12/29/2023 15:04   CT Head Wo Contrast Result Date: 12/29/2023 CLINICAL DATA:  Altered level of consciousness, fell out of bed, found down EXAM: CT HEAD WITHOUT CONTRAST TECHNIQUE: Contiguous axial images were obtained from the base of the skull through the vertex without intravenous contrast. RADIATION DOSE REDUCTION: This exam was performed according to the departmental dose-optimization program which includes automated exposure control, adjustment of the mA and/or kV according to patient size and/or use of iterative reconstruction technique. COMPARISON:  None Available. FINDINGS: Brain: Focal hypodensities left basal ganglia with associated ex vacuo dilatation of the left lateral ventricle consistent with chronic lacunar infarcts. Focal hypodensity in the right thalamus as well as mild hypodensity in the left frontal periventricular white matter compatible with age-indeterminate small vessel ischemic changes, likely chronic. No other signs of acute infarct or hemorrhage. Lateral ventricles and midline structures are otherwise unremarkable. No acute extra-axial fluid collections. No mass effect. Vascular: No hyperdense vessel or unexpected calcification. Skull: Normal. Negative for fracture or focal lesion. Sinuses/Orbits: No acute finding. Other: None. IMPRESSION: 1. Chronic appearing small vessel ischemic changes within the left basal ganglia, right thalamus, and  left frontal periventricular white matter. 2. No acute intracranial trauma. Electronically Signed   By: Ozell Daring M.D.   On: 12/29/2023 15:02     Signature  -   Lavada Stank M.D on 12/31/2023 at 7:23 AM   -  To page go to [email or web address]

## 2023-12-31 NOTE — Plan of Care (Signed)
  Problem: Education: Goal: Knowledge of risk factors and measures for prevention of condition will improve 12/31/2023 0714 by Elnor Zachary CROME, RN Outcome: Progressing 12/31/2023 0714 by Elnor Zachary CROME, RN Outcome: Progressing   Problem: Coping: Goal: Psychosocial and spiritual needs will be supported 12/31/2023 0714 by Elnor Zachary CROME, RN Outcome: Progressing 12/31/2023 0714 by Elnor Zachary CROME, RN Outcome: Progressing   Problem: Respiratory: Goal: Will maintain a patent airway 12/31/2023 0714 by Elnor Zachary CROME, RN Outcome: Progressing 12/31/2023 0714 by Elnor Zachary CROME, RN Outcome: Progressing Goal: Complications related to the disease process, condition or treatment will be avoided or minimized 12/31/2023 0714 by Elnor Zachary CROME, RN Outcome: Progressing 12/31/2023 0714 by Elnor Zachary CROME, RN Outcome: Progressing   Problem: Education: Goal: Knowledge of General Education information will improve Description: Including pain rating scale, medication(s)/side effects and non-pharmacologic comfort measures 12/31/2023 0714 by Elnor Zachary CROME, RN Outcome: Progressing 12/31/2023 0714 by Elnor Zachary CROME, RN Outcome: Progressing   Problem: Health Behavior/Discharge Planning: Goal: Ability to manage health-related needs will improve 12/31/2023 0714 by Elnor Zachary CROME, RN Outcome: Progressing 12/31/2023 0714 by Elnor Zachary CROME, RN Outcome: Progressing   Problem: Clinical Measurements: Goal: Ability to maintain clinical measurements within normal limits will improve 12/31/2023 0714 by Elnor Zachary CROME, RN Outcome: Progressing 12/31/2023 0714 by Elnor Zachary CROME, RN Outcome: Progressing Goal: Will remain free from infection 12/31/2023 0714 by Elnor Zachary CROME, RN Outcome: Progressing 12/31/2023 0714 by Elnor Zachary CROME, RN Outcome: Progressing Goal: Diagnostic test results will improve 12/31/2023 0714 by Elnor Zachary CROME, RN Outcome: Progressing 12/31/2023 0714 by Elnor Zachary CROME, RN Outcome: Progressing Goal:  Respiratory complications will improve 12/31/2023 0714 by Elnor Zachary CROME, RN Outcome: Progressing 12/31/2023 0714 by Elnor Zachary CROME, RN Outcome: Progressing Goal: Cardiovascular complication will be avoided 12/31/2023 0714 by Elnor Zachary CROME, RN Outcome: Progressing 12/31/2023 0714 by Elnor Zachary CROME, RN Outcome: Progressing   Problem: Activity: Goal: Risk for activity intolerance will decrease 12/31/2023 0714 by Elnor Zachary CROME, RN Outcome: Progressing 12/31/2023 0714 by Elnor Zachary CROME, RN Outcome: Progressing   Problem: Nutrition: Goal: Adequate nutrition will be maintained 12/31/2023 0714 by Elnor Zachary CROME, RN Outcome: Progressing 12/31/2023 0714 by Elnor Zachary CROME, RN Outcome: Progressing   Problem: Coping: Goal: Level of anxiety will decrease 12/31/2023 0714 by Elnor Zachary CROME, RN Outcome: Progressing 12/31/2023 0714 by Elnor Zachary CROME, RN Outcome: Progressing   Problem: Elimination: Goal: Will not experience complications related to bowel motility 12/31/2023 0714 by Elnor Zachary CROME, RN Outcome: Progressing 12/31/2023 0714 by Elnor Zachary CROME, RN Outcome: Progressing Goal: Will not experience complications related to urinary retention 12/31/2023 0714 by Elnor Zachary CROME, RN Outcome: Progressing 12/31/2023 0714 by Elnor Zachary CROME, RN Outcome: Progressing   Problem: Pain Managment: Goal: General experience of comfort will improve and/or be controlled 12/31/2023 0714 by Elnor Zachary CROME, RN Outcome: Progressing 12/31/2023 0714 by Elnor Zachary CROME, RN Outcome: Progressing   Problem: Safety: Goal: Ability to remain free from injury will improve 12/31/2023 0714 by Elnor Zachary CROME, RN Outcome: Progressing 12/31/2023 0714 by Elnor Zachary CROME, RN Outcome: Progressing   Problem: Skin Integrity: Goal: Risk for impaired skin integrity will decrease 12/31/2023 0714 by Elnor Zachary CROME, RN Outcome: Progressing 12/31/2023 0714 by Elnor Zachary CROME, RN Outcome: Progressing

## 2024-01-01 LAB — LDL CHOLESTEROL, DIRECT: Direct LDL: 735 mg/dL — ABNORMAL HIGH (ref 0–99)

## 2024-01-03 LAB — CULTURE, BLOOD (ROUTINE X 2)
Culture: NO GROWTH
Special Requests: ADEQUATE

## 2024-01-05 LAB — CULTURE, BLOOD (ROUTINE X 2)
Culture: NO GROWTH
Culture: NO GROWTH

## 2024-01-23 NOTE — Death Summary Note (Signed)
 DEATH SUMMARY   Patient Details  Name: [name] MRN: [id number] DOB: [birth year] PCP:Pcp, No  Admission/Discharge Information   Admit Date:  01-08-24  Date of Death: Date of Death: 2024-01-11  Time of Death: Time of Death: 0345  Length of Stay: 3   Principle Cause of death: Shock  Hospital Diagnoses: Principal Problem:   AKI (acute kidney injury) Active Problems:   Elevated troponin     Hospital Course: 73 year old female with past medical history of colon cancer 9 years ago, morbid obesity who presented to the hospital after a wellness check from her neighbors revealed that she has been on the floor for 2 days. She was suspected to be dehydrated and noted to have an acute kidney injury and subsequently was admitted to the floor. She was noted to also be incidentally COVID-19 positive. She was suspected to have a possible urinary tract infection and was started on Rocephin.   Her laboratory workup was significant for an acute kidney injury with elevated creatinine and BUN.  Hypocalcemia, CK of 380, BNP more than 4500, troponin 1700.  No leukocytosis.  White count is 8.9 with a normal differential.  Hemoglobin 13.3.   CT head performed showing no acute intracranial abnormality.  Chest x-ray with possible left lower lobe haziness.   She was noted to be more hypotensive and transferred to the ICU for further care.   In the ICU, the patient reported that she does not want any invasive measures.  She does not want a central line, or an HD line or an arterial line.  She is not interested in dialysis.  She stated that she has been through a lot and is not willing to undergo more invasive procedures or tests.  Although she is only alert and oriented x 2, she  had capacity to make these decisions and understood that she might die if she refused them.  Sister and Nephew came to bedside and they talked with the patient and she confirmed her wishes to them as well. Patient at that  time was in shock with significant ectopy on the telemetry monitor.  Her laboratory workup showed that she is in anuric renal failure with significant metabolic acidosis. She did not want any further workup and elected to pursue comfort measures only.   She passed away with her family at bedside. Time of death was 0345.   Assessment and Plan: No notes have been filed under this hospital service. Service: Hospitalist    The results of significant diagnostics from this hospitalization (including imaging, microbiology, ancillary and laboratory) are listed below for reference.   Significant Diagnostic Studies: VAS US  LOWER EXTREMITY VENOUS (DVT) Result Date: January 11, 2024  Lower Venous DVT Study Patient Name:  [name]  Date of Exam:   12/31/2023 Medical Rec #: [id number]         Accession #:    [id number] Date of Birth: 06-06-1950          Patient Gender: F Patient Age:   11 years Exam Location:  [facility] Procedure:      VAS US  LOWER EXTREMITY VENOUS (DVT) Referring Phys: ZOLA Syana Degraffenreid --------------------------------------------------------------------------------  Indications: Swelling.  Risk Factors: COVID 19 positive. Limitations: Body habitus, poor ultrasound/tissue interface and patient positioning. Comparison Study: No prior studies. Performing Technologist: Cordella Collet RVT  Examination Guidelines: A complete evaluation includes B-mode imaging, spectral Doppler, color Doppler, and power Doppler as needed of all accessible portions of each vessel. Bilateral testing is considered an integral part  of a complete examination. Limited examinations for reoccurring indications may be performed as noted. The reflux portion of the exam is performed with the patient in reverse Trendelenburg.  +---------+---------------+---------+-----------+----------+--------------+ RIGHT    CompressibilityPhasicitySpontaneityPropertiesThrombus Aging  +---------+---------------+---------+-----------+----------+--------------+ CFV      Full           Yes      Yes                                 +---------+---------------+---------+-----------+----------+--------------+ SFJ      Full                                                        +---------+---------------+---------+-----------+----------+--------------+ FV Prox  Full                                                        +---------+---------------+---------+-----------+----------+--------------+ FV Mid   Full                                                        +---------+---------------+---------+-----------+----------+--------------+ FV DistalFull                                                        +---------+---------------+---------+-----------+----------+--------------+ PFV      Full                                                        +---------+---------------+---------+-----------+----------+--------------+ POP      Full           Yes      Yes                                 +---------+---------------+---------+-----------+----------+--------------+ PTV      Full                                                        +---------+---------------+---------+-----------+----------+--------------+ PERO     Full                                                        +---------+---------------+---------+-----------+----------+--------------+   +---------+---------------+---------+-----------+----------+-------------------+ LEFT     CompressibilityPhasicitySpontaneityPropertiesThrombus Aging      +---------+---------------+---------+-----------+----------+-------------------+ CFV  Full           Yes      Yes                                      +---------+---------------+---------+-----------+----------+-------------------+ SFJ      Full                                                              +---------+---------------+---------+-----------+----------+-------------------+ FV Prox  Full                                                             +---------+---------------+---------+-----------+----------+-------------------+ FV Mid   Full                                                             +---------+---------------+---------+-----------+----------+-------------------+ FV Distal               Yes      Yes                                      +---------+---------------+---------+-----------+----------+-------------------+ PFV      Full                                                             +---------+---------------+---------+-----------+----------+-------------------+ POP      Full           Yes      Yes                                      +---------+---------------+---------+-----------+----------+-------------------+ PTV      Full                                                             +---------+---------------+---------+-----------+----------+-------------------+ PERO                                                  Not well visualized +---------+---------------+---------+-----------+----------+-------------------+     Summary: RIGHT: - There is no evidence of deep vein thrombosis in the lower extremity. However, portions of this examination were limited- see technologist comments above.  -  No cystic structure found in the popliteal fossa.  LEFT: - There is no evidence of deep vein thrombosis in the lower extremity. However, portions of this examination were limited- see technologist comments above.  - No cystic structure found in the popliteal fossa.  *See table(s) above for measurements and observations. Electronically signed by Debby Robertson on 01/26/24 at 2:12:23 PM.    Final    US  RENAL Result Date: 12/30/2023 EXAM: US  Retroperitoneum Complete, Renal. CLINICAL HISTORY: AKI (acute kidney injury). TECHNIQUE: Real-time ultrasound  of the retroperitoneum (complete) with image documentation. COMPARISON: None provided. FINDINGS: RIGHT KIDNEY: Bilateral cortical thinning and increased parenchymal echogenicity. The right kidney measures 12.8 x 5.5 x 4.4 cm. Volume 161 ml. A 2.3 cm avascular cyst in the right kidney. No follow up recommended. No hydronephrosis or renal stone visualized. LEFT KIDNEY: Bilateral cortical thinning and increased parenchymal echogenicity. The left kidney measures 12.0 x 6.2 x 5.3 cm. Volume 205 ml. No hydronephrosis, renal stone, or mass visualized. BLADDER: Foley catheter in the bladder. IMPRESSION: 1. Bilateral cortical thinning and increased parenchymal echogenicity in the kidneys, consistent with medical renal disease. Electronically signed by: Norman Gatlin MD 12/30/2023 11:07 PM EST RP Workstation: [id number]   DG Chest Port 1 View Result Date: 12/30/2023 EXAM: 1 VIEW(S) XRAY OF THE CHEST 12/30/2023 06:35:49 AM COMPARISON: 12/29/2023 CLINICAL HISTORY: SOB (shortness of breath) FINDINGS: LUNGS AND PLEURA: An ill-defined hazy opacity is noted within the left lung base and appears new from the prior exam. No pulmonary edema. No pleural effusion. No pneumothorax. HEART AND MEDIASTINUM: No acute abnormality of the cardiac and mediastinal silhouettes. BONES AND SOFT TISSUES: No acute osseous abnormality. IMPRESSION: 1. New ill-defined hazy opacity at the left lung base, new from prior exam, suspicious for acute airspace disease such as pneumonia or aspiration. Electronically signed by: Waddell Calk MD 12/30/2023 06:44 AM EST RP Workstation: [id number]   CT HEAD WO CONTRAST ( ) Result Date: 12/30/2023 EXAM: CT HEAD WITHOUT CONTRAST 12/30/2023 04:35:01 AM TECHNIQUE: CT of the head was performed without the administration of intravenous contrast. Automated exposure control, iterative reconstruction, and/or weight based adjustment of the mA/kV was utilized to reduce the radiation dose to as low as reasonably  achievable. COMPARISON: Head CT dated 12/29/2023. CLINICAL HISTORY: Mental status change, unknown cause. 73 year old female recent fall. Altered mental status. FINDINGS: BRAIN AND VENTRICLES: No acute hemorrhage. No evidence of acute infarct. No hydrocephalus. No extra-axial collection. No mass effect or midline shift. Chronic lacunar infarct left basal ganglia is stable. Patchy and scattered additional bilateral cerebral white matter hypodensity is stable, mild to moderate for age. Additional heterogeneity in the bilateral deep gray nuclei compatible with chronic small vessel disease appears stable. Stable gray white differentiation. No suspicious intracranial vascular hyperdensity. ORBITS: No acute abnormality. SINUSES: Visible paranasal sinuses, middle ears and mastoids remain well aerated. SOFT TISSUES AND SKULL: No acute soft tissue abnormality. No skull fracture. Calcified atherosclerosis at the skull base. IMPRESSION: 1. No acute intracranial abnormality. 2. Stable  non contrast CT appearance of chronic small vessel disease. Electronically signed by: Helayne Hurst MD 12/30/2023 05:45 AM EST RP Workstation: [id number]   ECHOCARDIOGRAM COMPLETE Result Date: 12/29/2023    ECHOCARDIOGRAM REPORT   Patient Name:   Jermesha Sottile Date of Exam: 12/29/2023 Medical Rec #:  [id number]        Height:       65.0 in Accession #:    [id number]       Weight:  260.0 lb Date of Birth:  11/29/1950         BSA:          2.211 m Patient Age:    73 years         BP:           124/88 mmHg Patient Gender: F                HR:           89 bpm. Exam Location:  Inpatient Procedure: 2D Echo, Cardiac Doppler and Color Doppler (Both Spectral and Color            Flow Doppler were utilized during procedure). Indications:    Elevated Troponin  History:        Patient has no prior history of Echocardiogram examinations.  Sonographer:    Damien Senior RDCS Referring Phys: MAUDE JAYSON EMMER  Sonographer Comments: Technically difficult study  due to patient body habitus. IMPRESSIONS  1. Left ventricular ejection fraction, by estimation, is 55 to 60%. The left ventricle has normal function. The left ventricle has no regional wall motion abnormalities. There is severe concentric left ventricular hypertrophy. Left ventricular diastolic  parameters are consistent with Grade I diastolic dysfunction (impaired relaxation).  2. Right ventricular systolic function is normal. The right ventricular size is not well visualized. Tricuspid regurgitation signal is inadequate for assessing PA pressure.  3. The mitral valve is grossly normal. Trivial mitral valve regurgitation.  4. The aortic valve is tricuspid. Aortic valve regurgitation is not visualized. Aortic valve sclerosis is present, with no evidence of aortic valve stenosis.  5. The inferior vena cava is normal in size with greater than 50% respiratory variability, suggesting right atrial pressure of 3 mmHg. FINDINGS  Left Ventricle: Left ventricular ejection fraction, by estimation, is 55 to 60%. The left ventricle has normal function. The left ventricle has no regional wall motion abnormalities. Definity contrast agent was given IV to delineate the left ventricular  endocardial borders. Strain was performed and the global longitudinal strain is indeterminate. The left ventricular internal cavity size was normal in size. There is severe concentric left ventricular hypertrophy. Left ventricular diastolic parameters are consistent with Grade I diastolic dysfunction (impaired relaxation). Right Ventricle: The right ventricular size is not well visualized. Right vetricular wall thickness was not well visualized. Right ventricular systolic function is normal. Tricuspid regurgitation signal is inadequate for assessing PA pressure. Left Atrium: Left atrial size was normal in size. Right Atrium: Right atrial size was not well visualized. Pericardium: Trivial pericardial effusion is present. Mitral Valve: The mitral  valve is grossly normal. Trivial mitral valve regurgitation. Tricuspid Valve: The tricuspid valve is grossly normal. Tricuspid valve regurgitation is not demonstrated. Aortic Valve: The aortic valve is tricuspid. Aortic valve regurgitation is not visualized. Aortic valve sclerosis is present, with no evidence of aortic valve stenosis. Pulmonic Valve: The pulmonic valve was grossly normal. Pulmonic valve regurgitation is not visualized. Aorta: The aortic root and ascending aorta are structurally normal, with no evidence of dilitation. Venous: The inferior vena cava is normal in size with greater than 50% respiratory variability, suggesting right atrial pressure of 3 mmHg. IAS/Shunts: No atrial level shunt detected by color flow Doppler. Additional Comments: 3D was performed not requiring image post processing on an independent workstation and was indeterminate.  LEFT VENTRICLE PLAX 2D LVIDd:         3.00 cm   Diastology LVIDs:         2.10  cm   LV e' medial:    3.70 cm/s LV PW:         1.90 cm   LV E/e' medial:  16.3 LV IVS:        1.90 cm   LV e' lateral:   3.92 cm/s LVOT diam:     2.20 cm   LV E/e' lateral: 15.4 LV SV:         77 LV SV Index:   35 LVOT Area:     3.80 cm  RIGHT VENTRICLE RV S prime:     11.60 cm/s TAPSE (M-mode): 1.6 cm LEFT ATRIUM             Index LA diam:        3.00 cm 1.36 cm/m LA Vol (A2C):   44.8 ml 20.26 ml/m LA Vol (A4C):   40.5 ml 18.31 ml/m LA Biplane Vol: 43.9 ml 19.85 ml/m  AORTIC VALVE LVOT Vmax:   141.00 cm/s LVOT Vmean:  108.000 cm/s LVOT VTI:    0.203 m  AORTA Ao Root diam: 2.90 cm Ao Asc diam:  3.50 cm MITRAL VALVE MV Area (PHT): 3.07 cm     SHUNTS MV Decel Time: 247 msec     Systemic VTI:  0.20 m MV E velocity: 60.20 cm/s   Systemic Diam: 2.20 cm MV A velocity: 102.00 cm/s MV E/A ratio:  0.59 Maude Emmer MD Electronically signed by Maude Emmer MD Signature Date/Time: 12/29/2023/4:18:35 PM    Final    DG Chest Port 1 View Result Date: 12/29/2023 CLINICAL DATA:  Sepsis.  EXAM: PORTABLE CHEST 1 VIEW COMPARISON:  Chest radiograph dated 04/18/2007. FINDINGS: Minimal left lung base atelectasis. No focal consolidation, pleural effusion or pneumothorax. Thickened right perihilar and paramediastinal tissue of indeterminate etiology. Further evaluation with chest CT is recommended. Stable cardiomegaly. No acute osseous pathology. IMPRESSION: 1. No focal consolidation. 2. Thickened right perihilar and paramediastinal tissue of indeterminate etiology. Further evaluation with chest CT is recommended. Electronically Signed   By: Vanetta Chou M.D.   On: 12/29/2023 16:13   CT Cervical Spine Wo Contrast Result Date: 12/29/2023 CLINICAL DATA:  Clemens out of bed 2 days ago, found down EXAM: CT CERVICAL SPINE WITHOUT CONTRAST TECHNIQUE: Multidetector CT imaging of the cervical spine was performed without intravenous contrast. Multiplanar CT image reconstructions were also generated. RADIATION DOSE REDUCTION: This exam was performed according to the departmental dose-optimization program which includes automated exposure control, adjustment of the mA and/or kV according to patient size and/or use of iterative reconstruction technique. COMPARISON:  None Available. FINDINGS: Alignment: Mild right convex curvature at the cervicothoracic junction. Minimal degenerative anterolisthesis of C7 on T1. Skull base and vertebrae: No acute fracture. No primary bone lesion or focal pathologic process. Soft tissues and spinal canal: No prevertebral fluid or swelling. No visible canal hematoma. Disc levels: Multilevel cervical spondylosis most pronounced at C4-5, C5-6, and C6-7. Lower cervical facet hypertrophic changes greatest at C7-T1. Upper chest: Airway is patent.  Lung apices are clear. Other: Reconstructed images demonstrate no additional findings. IMPRESSION: 1. No acute cervical spine fracture. 2. Multilevel lower cervical spondylosis and facet hypertrophy as above. Electronically Signed   By: Ozell Daring M.D.   On: 12/29/2023 15:04   CT Head Wo Contrast Result Date: 12/29/2023 CLINICAL DATA:  Altered level of consciousness, fell out of bed, found down EXAM: CT HEAD WITHOUT CONTRAST TECHNIQUE: Contiguous axial images were obtained from the base of the skull through the vertex without intravenous contrast.  RADIATION DOSE REDUCTION: This exam was performed according to the departmental dose-optimization program which includes automated exposure control, adjustment of the mA and/or kV according to patient size and/or use of iterative reconstruction technique. COMPARISON:  None Available. FINDINGS: Brain: Focal hypodensities left basal ganglia with associated ex vacuo dilatation of the left lateral ventricle consistent with chronic lacunar infarcts. Focal hypodensity in the right thalamus as well as mild hypodensity in the left frontal periventricular white matter compatible with age-indeterminate small vessel ischemic changes, likely chronic. No other signs of acute infarct or hemorrhage. Lateral ventricles and midline structures are otherwise unremarkable. No acute extra-axial fluid collections. No mass effect. Vascular: No hyperdense vessel or unexpected calcification. Skull: Normal. Negative for fracture or focal lesion. Sinuses/Orbits: No acute finding. Other: None. IMPRESSION: 1. Chronic appearing small vessel ischemic changes within the left basal ganglia, right thalamus, and left frontal periventricular white matter. 2. No acute intracranial trauma. Electronically Signed   By: Ozell Daring M.D.   On: 12/29/2023 15:02    Microbiology: Recent Results (from the past 240 hours)  Blood Culture (routine x 2)     Status: Abnormal   Collection Time: 12/29/23  1:45 PM   Specimen: BLOOD  Result Value Ref Range Status   Specimen Description BLOOD LEFT ANTECUBITAL  Final   Special Requests   Final    BOTTLES DRAWN AEROBIC AND ANAEROBIC Blood Culture adequate volume   Culture  Setup Time   Final     GRAM POSITIVE COCCI IN CLUSTERS AEROBIC BOTTLE ONLY CRITICAL RESULT CALLED TO, READ BACK BY AND VERIFIED WITH: E. SINCLAIR PHARMD, AT 1110 12/30/23 D. VANHOOK    Culture (A)  Final    STAPHYLOCOCCUS EPIDERMIDIS THE SIGNIFICANCE OF ISOLATING THIS ORGANISM FROM A SINGLE SET OF BLOOD CULTURES WHEN MULTIPLE SETS ARE DRAWN IS UNCERTAIN. PLEASE NOTIFY THE MICROBIOLOGY DEPARTMENT WITHIN ONE WEEK IF SPECIATION AND SENSITIVITIES ARE REQUIRED. Performed at [facility], [address], [place], [place] [address]    Report Status 12/31/2023 FINAL  Final  Blood Culture ID Panel (Reflexed)     Status: Abnormal   Collection Time: 12/29/23  1:45 PM  Result Value Ref Range Status   Enterococcus faecalis NOT DETECTED NOT DETECTED Final   Enterococcus Faecium NOT DETECTED NOT DETECTED Final   Listeria monocytogenes NOT DETECTED NOT DETECTED Final   Staphylococcus species DETECTED (A) NOT DETECTED Final    Comment: CRITICAL RESULT CALLED TO, READ BACK BY AND VERIFIED WITH: E. SINCLAIR PHARMD, AT 1110 12/30/23 D. VANHOOK    Staphylococcus aureus (BCID) NOT DETECTED NOT DETECTED Final   Staphylococcus epidermidis DETECTED (A) NOT DETECTED Final    Comment: Methicillin (oxacillin) resistant coagulase negative staphylococcus. Possible blood culture contaminant (unless isolated from more than one blood culture draw or clinical case suggests pathogenicity). No antibiotic treatment is indicated for blood  culture contaminants. CRITICAL RESULT CALLED TO, READ BACK BY AND VERIFIED WITH: E. SINCLAIR PHARMD, AT 1110 12/30/23 D. VANHOOK    Staphylococcus lugdunensis NOT DETECTED NOT DETECTED Final   Streptococcus species NOT DETECTED NOT DETECTED Final   Streptococcus agalactiae NOT DETECTED NOT DETECTED Final   Streptococcus pneumoniae NOT DETECTED NOT DETECTED Final   Streptococcus pyogenes NOT DETECTED NOT DETECTED Final   A.calcoaceticus-baumannii NOT DETECTED NOT DETECTED Final   Bacteroides fragilis NOT  DETECTED NOT DETECTED Final   Enterobacterales NOT DETECTED NOT DETECTED Final   Enterobacter cloacae complex NOT DETECTED NOT DETECTED Final   Escherichia coli NOT DETECTED NOT DETECTED Final  Klebsiella aerogenes NOT DETECTED NOT DETECTED Final   Klebsiella oxytoca NOT DETECTED NOT DETECTED Final   Klebsiella pneumoniae NOT DETECTED NOT DETECTED Final   Proteus species NOT DETECTED NOT DETECTED Final   Salmonella species NOT DETECTED NOT DETECTED Final   Serratia marcescens NOT DETECTED NOT DETECTED Final   Haemophilus influenzae NOT DETECTED NOT DETECTED Final   Neisseria meningitidis NOT DETECTED NOT DETECTED Final   Pseudomonas aeruginosa NOT DETECTED NOT DETECTED Final   Stenotrophomonas maltophilia NOT DETECTED NOT DETECTED Final   Candida albicans NOT DETECTED NOT DETECTED Final   Candida auris NOT DETECTED NOT DETECTED Final   Candida glabrata NOT DETECTED NOT DETECTED Final   Candida krusei NOT DETECTED NOT DETECTED Final   Candida parapsilosis NOT DETECTED NOT DETECTED Final   Candida tropicalis NOT DETECTED NOT DETECTED Final   Cryptococcus neoformans/gattii NOT DETECTED NOT DETECTED Final   Methicillin resistance mecA/C DETECTED (A) NOT DETECTED Final    Comment: CRITICAL RESULT CALLED TO, READ BACK BY AND VERIFIED WITH: E. SINCLAIR PHARMD, AT 1110 12/30/23 D. VANHOOK Performed at [facility], [address] [place], [place] [address]   Resp panel by RT-PCR (RSV, Flu A&B, Covid) Anterior Nasal Swab     Status: Abnormal   Collection Time: 12/29/23  2:00 PM   Specimen: Anterior Nasal Swab  Result Value Ref Range Status   SARS Coronavirus 2 by RT PCR POSITIVE (A) NEGATIVE Final   Influenza A by PCR NEGATIVE NEGATIVE Final   Influenza B by PCR NEGATIVE NEGATIVE Final    Comment: (NOTE) The Xpert Xpress SARS-CoV-2/FLU/RSV plus assay is intended as an aid in the diagnosis of influenza from Nasopharyngeal swab specimens and should not be used as a sole basis for  treatment. Nasal washings and aspirates are unacceptable for Xpert Xpress SARS-CoV-2/FLU/RSV testing.  Fact Sheet for Patients: bloggercourse.com  Fact Sheet for Healthcare Providers: seriousbroker.it  This test is not yet approved or cleared by the [place]  FDA and has been authorized for detection and/or diagnosis of SARS-CoV-2 by FDA under an Emergency Use Authorization (EUA). This EUA will remain in effect (meaning this test can be used) for the duration of the COVID-19 declaration under Section 564(b)(1) of the Act, 21 U.S.C. section 360bbb-3(b)(1), unless the authorization is terminated or revoked.     Resp Syncytial Virus by PCR NEGATIVE NEGATIVE Final    Comment: (NOTE) Fact Sheet for Patients: bloggercourse.com  Fact Sheet for Healthcare Providers: seriousbroker.it  This test is not yet approved or cleared by the [place]  FDA and has been authorized for detection and/or diagnosis of SARS-CoV-2 by FDA under an Emergency Use Authorization (EUA). This EUA will remain in effect (meaning this test can be used) for the duration of the COVID-19 declaration under Section 564(b)(1) of the Act, 21 U.S.C. section 360bbb-3(b)(1), unless the authorization is terminated or revoked.  Performed at [facility], [address], [place], [place] [address]   Blood Culture (routine x 2)     Status: None (Preliminary result)   Collection Time: 12/29/23  3:49 PM   Specimen: BLOOD  Result Value Ref Range Status   Specimen Description BLOOD RIGHT ANTECUBITAL  Final   Special Requests   Final    BOTTLES DRAWN AEROBIC AND ANAEROBIC Blood Culture adequate volume   Culture   Final    NO GROWTH 4 DAYS Performed at [facility], [address] [place], [place] [address]    Report Status PENDING  Incomplete  MRSA Next Gen by PCR, Nasal     Status: None   Collection  Time: 12/31/23 11:35 AM   Specimen: Nasal Mucosa; Nasal Swab  Result Value Ref Range Status   MRSA by PCR Next Gen NOT DETECTED NOT DETECTED Final    Comment: (NOTE) The GeneXpert MRSA Assay (FDA approved for NASAL specimens only), is one component of a comprehensive MRSA colonization surveillance program. It is not intended to diagnose MRSA infection nor to guide or monitor treatment for MRSA infections. Test performance is not FDA approved in patients less than 64 years old. Performed at [facility], [address], [place], [place] [address]   Culture, blood (Routine X 2) w Reflex to ID Panel     Status: None (Preliminary result)   Collection Time: 12/31/23 11:54 AM   Specimen: BLOOD RIGHT HAND  Result Value Ref Range Status   Specimen Description   Final    BLOOD RIGHT HAND Performed at [facility], [address] [place], [place] [address]    Special Requests   Final    BOTTLES DRAWN AEROBIC ONLY Blood Culture results may not be optimal due to an inadequate volume of blood received in culture bottles Performed at [facility], [address], [place], [place] [address]    Culture   Final    NO GROWTH 2 DAYS Performed at [facility], [address], [place], [place] [address]    Report Status PENDING  Incomplete  Culture, blood (Routine X 2) w Reflex to ID Panel     Status: None (Preliminary result)   Collection Time: 12/31/23 12:31 PM   Specimen: BLOOD RIGHT HAND  Result Value Ref Range Status   Specimen Description   Final    BLOOD RIGHT HAND Performed at [facility], [address], [place], [place] [address]    Special Requests   Final    BOTTLES DRAWN AEROBIC AND ANAEROBIC Blood Culture results may not be optimal due to an inadequate volume of blood received in culture bottles Performed at [facility], [address], [place], [place] [address]    Culture   Final    NO GROWTH 2  DAYS Performed at [facility], [address], [place], [place] [address]    Report Status PENDING  Incomplete    Time spent: 23 minutes  Signed: Zola LOISE Herter, MD January 05, 2024

## 2024-01-23 NOTE — Plan of Care (Signed)
  Problem: Coping: Goal: Psychosocial and spiritual needs will be supported Outcome: Progressing   Problem: Respiratory: Goal: Will maintain a patent airway Outcome: Progressing   Problem: Education: Goal: Knowledge of risk factors and measures for prevention of condition will improve Outcome: Not Progressing   Problem: Respiratory: Goal: Complications related to the disease process, condition or treatment will be avoided or minimized Outcome: Not Progressing   Problem: Education: Goal: Knowledge of General Education information will improve Description: Including pain rating scale, medication(s)/side effects and non-pharmacologic comfort measures Outcome: Not Progressing   Problem: Health Behavior/Discharge Planning: Goal: Ability to manage health-related needs will improve Outcome: Not Progressing   Problem: Clinical Measurements: Goal: Ability to maintain clinical measurements within normal limits will improve Outcome: Not Progressing Goal: Diagnostic test results will improve Outcome: Not Progressing   Problem: Nutrition: Goal: Adequate nutrition will be maintained Outcome: Not Progressing

## 2024-01-23 NOTE — Progress Notes (Signed)
 Pt expired at 0345 with her sister and nephew at bedside. Death confirmed by Waddell Barefoot RN and Candy Minerva RN. Elink provider Dr. Joelyn notified with time of death. Patient's only belonging at bedside was a dress and patient's sister confirmed that she did not want it.

## 2024-01-23 DEATH — deceased
